# Patient Record
Sex: Female | Born: 1995 | Race: Black or African American | Hispanic: No | Marital: Married | Smoking: Never smoker
Health system: Southern US, Community
[De-identification: ages and names within clinical notes are randomized; demographics above are authoritative.]

## PROBLEM LIST (undated history)

## (undated) DIAGNOSIS — B009 Herpesviral infection, unspecified: Secondary | ICD-10-CM

## (undated) HISTORY — PX: OPEN REDUCTION INTERNAL FIXATION (ORIF) TIBIA/FIBULA FRACTURE: SHX5992

## (undated) HISTORY — DX: Herpesviral infection, unspecified: B00.9

---
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
## 2016-06-22 ENCOUNTER — Encounter (HOSPITAL_COMMUNITY): Payer: Self-pay | Admitting: *Deleted

## 2016-06-22 ENCOUNTER — Ambulatory Visit (HOSPITAL_COMMUNITY)
Admission: EM | Admit: 2016-06-22 | Discharge: 2016-06-22 | Disposition: A | Discharge status: Home / Self Care | Payer: Medicaid Other | Attending: Family Medicine | Admitting: Family Medicine

## 2016-06-22 DIAGNOSIS — Z79899 Other long term (current) drug therapy: Secondary | ICD-10-CM | POA: Insufficient documentation

## 2016-06-22 DIAGNOSIS — N3001 Acute cystitis with hematuria: Secondary | ICD-10-CM | POA: Insufficient documentation

## 2016-06-22 DIAGNOSIS — R300 Dysuria: Secondary | ICD-10-CM | POA: Diagnosis present

## 2016-06-22 LAB — POCT URINALYSIS DIP (DEVICE)
Bilirubin Urine: NEGATIVE
GLUCOSE, UA: NEGATIVE mg/dL
Ketones, ur: NEGATIVE mg/dL
Nitrite: NEGATIVE
PROTEIN: NEGATIVE mg/dL
Specific Gravity, Urine: 1.02 (ref 1.005–1.030)
UROBILINOGEN UA: 0.2 mg/dL (ref 0.0–1.0)
pH: 7 (ref 5.0–8.0)

## 2016-06-22 LAB — POCT PREGNANCY, URINE: Preg Test, Ur: NEGATIVE

## 2016-06-22 MED ORDER — CEPHALEXIN 500 MG PO CAPS: 500.0000 mg | ORAL_CAPSULE | Freq: Four times a day (QID) | ORAL | 0 refills | Status: DC

## 2016-06-22 MED ORDER — PHENAZOPYRIDINE HCL 200 MG PO TABS: 200.0000 mg | ORAL_TABLET | Freq: Three times a day (TID) | ORAL | 0 refills | Status: DC | PRN

## 2016-06-22 NOTE — Discharge Instructions (Addendum)
You are being treated today for a urinary tract infection. I have prescribed Keflex, take 1 tablet 4 times a day for 5 days. I have also prescribed Pyridium. Take 1 tablet a day 3 times a day for 2 days. Your urine will be sent for culture and you will be notified should any change in therapy be needed. Drink plenty of fluids and rest. Should your symptoms fail to resolve, follow up with your primary care provider or return to clinic.  °

## 2016-06-22 NOTE — ED Provider Notes (Signed)
CSN: [id number]     Arrival date & time 06/22/16  [id number] History   None    Chief Complaint  Patient presents with  . Dysuria   (Consider location/radiation/quality/duration/timing/severity/associated sxs/prior Treatment) 21 year old female presents to clinic with three-day chief complaint of dysuria, urgency, frequency, and chills. She denies any abdominal pain, denies fever, she denies any nausea, vomiting, diarrhea, states she has seen some traces of blood in her urine, and has been darker than normal. She is not currently on her period, she has a Implant for birth control, stating her periods are irregular.   The history is provided by the patient.  Dysuria    History reviewed. No pertinent past medical history. History reviewed. No pertinent surgical history. History reviewed. No pertinent family history. Social History  Substance Use Topics  . Smoking status: Never Smoker  . Smokeless tobacco: Never Used  . Alcohol use No   OB History    No data available     Review of Systems  Reason unable to perform ROS: As covered in history of present illness.  Genitourinary: Positive for dysuria.    Allergies  Codeine  Home Medications   Prior to Admission medications   Medication Sig Start Date End Date Taking? Authorizing Provider  ValACYclovir HCl (VALTREX PO) Take by mouth.   Yes Historical Provider, MD  amoxicillin (AMOXIL) 250 MG/5ML suspension 10 ml po TID x 10 days 04/21/13   Tammy Triplett, PA-C  cephALEXin (KEFLEX) 500 MG capsule Take 1 capsule (500 mg total) by mouth 4 (four) times daily. 06/22/16   Dorena BodoLawrence Goldie Tregoning, NP  phenazopyridine (PYRIDIUM) 200 MG tablet Take 1 tablet (200 mg total) by mouth 3 (three) times daily as needed for pain. 06/22/16   Dorena BodoLawrence Kyilee Gregg, NP   Meds Ordered and Administered this Visit  Medications - No data to display  BP 123/75 (BP Location: Right Arm)   Pulse 85   Temp 99.1 F (37.3 C) (Oral)   Resp 16   SpO2 100%  No data  found.   Physical Exam  Constitutional: She is oriented to person, place, and time. She appears well-developed and well-nourished. No distress.  Cardiovascular: Normal rate and regular rhythm.   Pulmonary/Chest: Effort normal and breath sounds normal.  Abdominal: Soft. Bowel sounds are normal. She exhibits no distension and no mass. There is no tenderness. There is no guarding and no CVA tenderness.  Neurological: She is alert and oriented to person, place, and time.  Skin: Skin is warm and dry. Capillary refill takes less than 2 seconds. She is not diaphoretic.  Psychiatric: She has a normal mood and affect.  Nursing note and vitals reviewed.   Urgent Care Course     Procedures (including critical care time)  Labs Review Labs Reviewed  POCT URINALYSIS DIP (DEVICE) - Abnormal; Notable for the following:       Result Value   Hgb urine dipstick TRACE (*)    Leukocytes, UA SMALL (*)    All other components within normal limits  URINE CULTURE  POCT PREGNANCY, URINE    Imaging Review No results found.   Visual Acuity Review  Right Eye Distance:   Left Eye Distance:   Bilateral Distance:    Right Eye Near:   Left Eye Near:    Bilateral Near:         MDM   1. Acute cystitis with hematuria    You are being treated today for a urinary tract infection. I have prescribed Keflex, take  1 tablet 4 times a day for 5 days. I have also prescribed Pyridium. Take 1 tablet a day 3 times a day for 2 days. Your urine will be sent for culture and you will be notified should any change in therapy be needed. Drink plenty of fluids and rest. Should your symptoms fail to resolve, follow up with your primary care provider or return to clinic.      Dorena Bodo, NP 06/22/16 2013

## 2016-06-22 NOTE — ED Triage Notes (Signed)
Pt  Reports  A   3  day   Episode   Of  Burning  On  Urination     And   some  Back  Pain  Pt    Dark  Blood   As   Well   After  Urination  Pt  Has  A  History  Of  Herpes  But taking  Anti  Virals

## 2016-06-24 LAB — URINE CULTURE

## 2016-08-03 ENCOUNTER — Ambulatory Visit (HOSPITAL_COMMUNITY)
Admission: EM | Admit: 2016-08-03 | Discharge: 2016-08-03 | Disposition: A | Discharge status: Home / Self Care | Payer: Medicaid Other | Attending: Family Medicine | Admitting: Family Medicine

## 2016-08-03 ENCOUNTER — Emergency Department (HOSPITAL_COMMUNITY)
Admission: EM | Admit: 2016-08-03 | Discharge: 2016-08-03 | Discharge status: Against Medical Advice | Payer: Medicaid Other

## 2016-08-03 ENCOUNTER — Encounter (HOSPITAL_COMMUNITY): Payer: Self-pay | Admitting: Emergency Medicine

## 2016-08-03 DIAGNOSIS — R519 Headache, unspecified: Secondary | ICD-10-CM

## 2016-08-03 DIAGNOSIS — R51 Headache: Secondary | ICD-10-CM

## 2016-08-03 MED ORDER — BUTALBITAL-APAP-CAFFEINE 50-325-40 MG PO TABS: 1.0000 | ORAL_TABLET | Freq: Four times a day (QID) | ORAL | 0 refills | Status: AC | PRN

## 2016-08-03 NOTE — ED Triage Notes (Signed)
Pt has been suffering from a headache since yesterday.  She also reports blurred vision and dizziness that has since subsided.

## 2016-08-03 NOTE — ED Provider Notes (Signed)
[facility]    CSN: [id number] Arrival date & time: 08/03/16  1455     History   Chief Complaint Chief Complaint  Patient presents with  . Headache    HPI Meredith Hubbard is a 21 y.o. female.   Pt has been suffering from a headache since yesterday.  She also reports blurred vision and dizziness that has since subsided.  Works in a call center. She's had no nausea or vomiting. The headache feels like there is a band around her temples giving her steady pressure and sensation. She has no photophobia. She describes the pain as 8 out of 10.       History reviewed. No pertinent past medical history.  There are no active problems to display for this patient.   History reviewed. No pertinent surgical history.  OB History    No data available       Home Medications    Prior to Admission medications   Medication Sig Start Date End Date Taking? Authorizing Provider  ValACYclovir HCl (VALTREX PO) Take by mouth.   Yes Historical Provider, MD  butalbital-acetaminophen-caffeine (FIORICET, ESGIC) [phone] MG tablet Take 1-2 tablets by mouth every 6 (six) hours as needed for headache. 08/03/16 08/03/17  Elvina Sidle, MD  cephALEXin (KEFLEX) 500 MG capsule Take 1 capsule (500 mg total) by mouth 4 (four) times daily. 06/22/16   Dorena Bodo, NP  phenazopyridine (PYRIDIUM) 200 MG tablet Take 1 tablet (200 mg total) by mouth 3 (three) times daily as needed for pain. 06/22/16   Dorena Bodo, NP    Family History History reviewed. No pertinent family history.  Social History Social History  Substance Use Topics  . Smoking status: Never Smoker  . Smokeless tobacco: Never Used  . Alcohol use No     Allergies   Codeine   Review of Systems Review of Systems  HENT: Negative.   Gastrointestinal: Negative.   Neurological: Positive for dizziness and headaches.  All other systems reviewed and are negative.    Physical Exam Triage Vital Signs ED Triage  Vitals [08/03/16 1510]  Enc Vitals Group     BP      Pulse      Resp      Temp      Temp src      SpO2      Weight      Height      Head Circumference      Peak Flow      Pain Score 8     Pain Loc      Pain Edu?      Excl. in GC?    No data found.   Updated Vital Signs BP 119/79 (BP Location: Right Arm)   Pulse 84   Temp 98.4 F (36.9 C) (Oral)   LMP 07/26/2016 (Approximate)   SpO2 98%    Physical Exam  Constitutional: She appears well-developed and well-nourished.  HENT:  Head: Normocephalic.  Right Ear: External ear normal.  Left Ear: External ear normal.  Mouth/Throat: Oropharynx is clear and moist.  Eyes: Conjunctivae and EOM are normal. Pupils are equal, round, and reactive to light.  Neck: Normal range of motion. Neck supple.  Pulmonary/Chest: Effort normal.  Musculoskeletal: Normal range of motion.  Neurological: She is alert. No cranial nerve deficit. She exhibits normal muscle tone. Coordination normal.  Skin: Skin is warm and dry.  Nursing note and vitals reviewed.    UC Treatments / Results  Labs (all labs  ordered are listed, but only abnormal results are displayed) Labs Reviewed - No data to display  EKG  EKG Interpretation None       Radiology No results found.  Procedures Procedures (including critical care time)  Medications Ordered in UC Medications - No data to display   Initial Impression / Assessment and Plan / UC Course  I have reviewed the triage vital signs and the nursing notes.  Pertinent labs & imaging results that were available during my care of the patient were reviewed by me and considered in my medical decision making (see chart for details).     Final Clinical Impressions(s) / UC Diagnoses   Final diagnoses:  Bad headache    New Prescriptions New Prescriptions   BUTALBITAL-ACETAMINOPHEN-CAFFEINE (FIORICET, ESGIC) 50-325-40 MG TABLET    Take 1-2 tablets by mouth every 6 (six) hours as needed for headache.      Elvina Sidle, MD 08/03/16 [phone]

## 2016-08-03 NOTE — Discharge Instructions (Signed)
Return either here or the emergency room if headache worsens or does not resolve over the next 6-10 hours.

## 2018-05-04 ENCOUNTER — Other Ambulatory Visit: Payer: Self-pay

## 2018-05-04 ENCOUNTER — Encounter: Payer: Self-pay | Admitting: Emergency Medicine

## 2018-05-04 ENCOUNTER — Emergency Department
Admission: EM | Admit: 2018-05-04 | Discharge: 2018-05-04 | Disposition: A | Discharge status: Home / Self Care | Payer: Self-pay | Attending: Emergency Medicine | Admitting: Emergency Medicine

## 2018-05-04 DIAGNOSIS — J101 Influenza due to other identified influenza virus with other respiratory manifestations: Secondary | ICD-10-CM | POA: Insufficient documentation

## 2018-05-04 DIAGNOSIS — Z79899 Other long term (current) drug therapy: Secondary | ICD-10-CM | POA: Insufficient documentation

## 2018-05-04 LAB — INFLUENZA PANEL BY PCR (TYPE A & B)
Influenza A By PCR: NEGATIVE
Influenza B By PCR: POSITIVE — AB

## 2018-05-04 MED ORDER — ACETAMINOPHEN 325 MG PO TABS
650.0000 mg | ORAL_TABLET | Freq: Once | ORAL | Status: AC
  Administered 2018-05-04: 650 mg via ORAL

## 2018-05-04 MED ORDER — PSEUDOEPH-BROMPHEN-DM 30-2-10 MG/5ML PO SYRP: 5.0000 mL | ORAL_SOLUTION | Freq: Four times a day (QID) | ORAL | 0 refills | Status: DC | PRN

## 2018-05-04 MED ORDER — ACETAMINOPHEN 325 MG PO TABS
ORAL_TABLET | ORAL | Status: AC
  Filled 2018-05-04: qty 2

## 2018-05-04 MED ORDER — OSELTAMIVIR PHOSPHATE 75 MG PO CAPS: 75.0000 mg | ORAL_CAPSULE | Freq: Two times a day (BID) | ORAL | 0 refills | Status: AC

## 2018-05-04 NOTE — Discharge Instructions (Addendum)
Advised over-the-counter extra strength Tylenol/ibuprofen for body aches and fever.

## 2018-05-04 NOTE — ED Notes (Signed)
Pt has young child with her, pt's SO is also being seen, informed pt about flu restrictions, pt states she is trying to get her son's dad to come get him. Pt has mask applied on arrival.

## 2018-05-04 NOTE — ED Triage Notes (Addendum)
PT arrived via POV with reports of chills and fever for 3 days, pt states temp max at home was 102 this morning.  Pt states she took ibuprofen and tylenol as well as alka seltzer with no relief. Pt also reports body aches, has had sick contacts with children.

## 2018-05-04 NOTE — ED Provider Notes (Signed)
[facility] Emergency Department Provider Note   ____________________________________________   First MD Initiated Contact with Patient 05/04/18 1231     (approximate)  I have reviewed the triage vital signs and the nursing notes.   HISTORY  Chief Complaint Fever and Chills    HPI Meredith Hubbard is a 23 y.o. female patient presents with 3 days of chills and fever.  Patient states fevers is controlled with Tylenol ibuprofen.  Patient state nasal congestion, body aches and exposure to flu.  Patient not taken flu shot for this season.  Patient denies nausea, vomiting, diarrhea.  History reviewed. No pertinent past medical history.  There are no active problems to display for this patient.   History reviewed. No pertinent surgical history.  Prior to Admission medications   Medication Sig Start Date End Date Taking? Authorizing Provider  brompheniramine-pseudoephedrine-DM 30-2-10 MG/5ML syrup Take 5 mLs by mouth 4 (four) times daily as needed. 05/04/18   Joni Reining, PA-C  oseltamivir (TAMIFLU) 75 MG capsule Take 1 capsule (75 mg total) by mouth 2 (two) times daily for 5 days. 05/04/18 05/09/18  Joni Reining, PA-C  ValACYclovir HCl (VALTREX PO) Take by mouth.    [provider]    Allergies Codeine  History reviewed. No pertinent family history.  Social History Social History   Tobacco Use  . Smoking status: Never Smoker  . Smokeless tobacco: Never Used  Substance Use Topics  . Alcohol use: No  . Drug use: No    Review of Systems Constitutional: Fever/chills and body aches.  Eyes: No visual changes. ENT: No sore throat.  Nasal congestion and runny nose. Cardiovascular: Denies chest pain. Respiratory: Denies shortness of breath.  Nonproductive cough. Gastrointestinal: No abdominal pain.  No nausea, no vomiting.  No diarrhea.  No constipation. Genitourinary: Negative for dysuria. Musculoskeletal: Negative for back pain. Skin:  Negative for rash. Neurological: Negative for headaches, focal weakness or numbness. Allergic/Immunilogical: Codeine. ____________________________________________   PHYSICAL EXAM:  VITAL SIGNS: ED Triage Vitals  Enc Vitals Group     BP 05/04/18 1226 (!) 118/92     Pulse Rate 05/04/18 1226 99     Resp 05/04/18 1226 18     Temp 05/04/18 1226 99 F (37.2 C)     Temp Source 05/04/18 1226 Oral     SpO2 05/04/18 1226 97 %     Weight 05/04/18 1223 170 lb (77.1 kg)     Height 05/04/18 1223 5\' 1"  (1.549 m)     Head Circumference --      Peak Flow --      Pain Score --      Pain Loc --      Pain Edu? --      Excl. in GC? --    Constitutional: Alert and oriented. Well appearing and in no acute distress. Nose: Edematous nasal turbinates clear rhinorrhea.   Mouth/Throat: Mucous membranes are moist.  Oropharynx non-erythematous.  Postnasal drainage. Neck: No stridor. Hematological/Lymphatic/Immunilogical: No cervical lymphadenopathy. Cardiovascular: Normal rate, regular rhythm. Grossly normal heart sounds.  Good peripheral circulation. Respiratory: Normal respiratory effort.  No retractions. Lungs CTAB. Gastrointestinal: Soft and nontender. No distention. No abdominal bruits. No CVA tenderness. Neurologic:  Normal speech and language. No gross focal neurologic deficits are appreciated. No gait instability. Skin:  Skin is warm, dry and intact. No rash noted. Psychiatric: Mood and affect are normal. Speech and behavior are normal.  ____________________________________________   LABS (all labs ordered are listed, but only abnormal  results are displayed)  Labs Reviewed  INFLUENZA PANEL BY PCR (TYPE A & B) - Abnormal; Notable for the following components:      Result Value   Influenza B By PCR POSITIVE (*)    All other components within normal limits   ____________________________________________  EKG   ____________________________________________  RADIOLOGY  ED MD  interpretation:    Official radiology report(s): No results found.  ____________________________________________   PROCEDURES  Procedure(s) performed: None  Procedures  Critical Care performed: No  ____________________________________________   INITIAL IMPRESSION / ASSESSMENT AND PLAN / ED COURSE  As part of my medical decision making, I reviewed the following data within the electronic MEDICAL RECORD NUMBER   Patient presents with acute onset of fever/chills and body aches.  Patient was positive for influenza B.  Patient given discharge care instruction advised take medication as directed.  Patient advised follow-up today [facility] as needed.       ____________________________________________   FINAL CLINICAL IMPRESSION(S) / ED DIAGNOSES  Final diagnoses:  Influenza B     ED Discharge Orders         Ordered    oseltamivir (TAMIFLU) 75 MG capsule  2 times daily     05/04/18 1354    brompheniramine-pseudoephedrine-DM 30-2-10 MG/5ML syrup  4 times daily PRN     05/04/18 1354           Note:  This document was prepared using Dragon voice recognition software and may include unintentional dictation errors.    Joni Reining, PA-C 05/04/18 1356    Phineas Semen, MD 05/04/18 2033

## 2018-05-04 NOTE — ED Notes (Signed)
See triage note  Presents with body aches and fever  States fever at home was 102  Has been taking OTC meds for fever  Low grade fever noted on arrival

## 2018-11-24 ENCOUNTER — Other Ambulatory Visit: Payer: Self-pay

## 2018-11-24 ENCOUNTER — Encounter: Payer: Self-pay | Admitting: Emergency Medicine

## 2018-11-24 DIAGNOSIS — Z5321 Procedure and treatment not carried out due to patient leaving prior to being seen by health care provider: Secondary | ICD-10-CM | POA: Insufficient documentation

## 2018-11-24 DIAGNOSIS — R1084 Generalized abdominal pain: Secondary | ICD-10-CM | POA: Insufficient documentation

## 2018-11-24 NOTE — ED Triage Notes (Addendum)
Pt says she's been having vaginal bleeding for 32 days; today the flow increased to include large clots; changing tampon or pad every 30 minutes; pt says she's feeling a little weak; reports abd cramping, today the worst; pt says she was prescribed birth control pills by her provider to stop the bleeding; quit taking after one week because the bleeding didn't stop

## 2018-11-25 ENCOUNTER — Emergency Department
Admission: EM | Admit: 2018-11-25 | Discharge: 2018-11-25 | Disposition: A | Discharge status: Home / Self Care | Payer: Self-pay | Attending: Emergency Medicine | Admitting: Emergency Medicine

## 2018-11-25 ENCOUNTER — Emergency Department
Admission: EM | Admit: 2018-11-25 | Discharge: 2018-11-25 | Discharge status: Against Medical Advice | Payer: Self-pay | Attending: Emergency Medicine | Admitting: Emergency Medicine

## 2018-11-25 ENCOUNTER — Other Ambulatory Visit: Payer: Self-pay

## 2018-11-25 ENCOUNTER — Encounter: Payer: Self-pay | Admitting: Emergency Medicine

## 2018-11-25 DIAGNOSIS — N938 Other specified abnormal uterine and vaginal bleeding: Secondary | ICD-10-CM | POA: Insufficient documentation

## 2018-11-25 DIAGNOSIS — N939 Abnormal uterine and vaginal bleeding, unspecified: Secondary | ICD-10-CM

## 2018-11-25 LAB — URINALYSIS, COMPLETE (UACMP) WITH MICROSCOPIC
Bacteria, UA: NONE SEEN
RBC / HPF: >50 RBC/hpf — ABNORMAL HIGH (ref 0–5)
Specific Gravity, Urine: 1.029 (ref 1.005–1.030)
WBC, UA: >50 WBC/hpf — ABNORMAL HIGH (ref 0–5)

## 2018-11-25 LAB — LIPASE, BLOOD: Lipase: 28 U/L (ref 11–51)

## 2018-11-25 LAB — BASIC METABOLIC PANEL
Anion gap: 7 (ref 5–15)
BUN: 11 mg/dL (ref 6–20)
CO2: 24 mmol/L (ref 22–32)
Calcium: 8.8 mg/dL — ABNORMAL LOW (ref 8.9–10.3)
Chloride: 108 mmol/L (ref 98–111)
Creatinine, Ser: 0.64 mg/dL (ref 0.44–1.00)
GFR calc Af Amer: >60 mL/min (ref >60)
GFR calc non Af Amer: >60 mL/min (ref >60)
Glucose, Bld: 84 mg/dL (ref 70–99)
Potassium: 4 mmol/L (ref 3.5–5.1)
Sodium: 139 mmol/L (ref 135–145)

## 2018-11-25 LAB — CBC
HCT: 35.3 % — ABNORMAL LOW (ref 36.0–46.0)
HCT: 36.3 % (ref 36.0–46.0)
Hemoglobin: 11.6 g/dL — ABNORMAL LOW (ref 12.0–15.0)
Hemoglobin: 12 g/dL (ref 12.0–15.0)
MCH: 30.1 pg (ref 26.0–34.0)
MCH: 30.7 pg (ref 26.0–34.0)
MCHC: 32.9 g/dL (ref 30.0–36.0)
MCHC: 33.1 g/dL (ref 30.0–36.0)
MCV: 91 fL (ref 80.0–100.0)
MCV: 93.4 fL (ref 80.0–100.0)
Platelets: 284 10*3/uL (ref 150–400)
Platelets: 295 10*3/uL (ref 150–400)
RBC: 3.78 MIL/uL — ABNORMAL LOW (ref 3.87–5.11)
RBC: 3.99 MIL/uL (ref 3.87–5.11)
RDW: 12.9 % (ref 11.5–15.5)
RDW: 13.1 % (ref 11.5–15.5)
WBC: 11.5 10*3/uL — ABNORMAL HIGH (ref 4.0–10.5)
WBC: 8.7 10*3/uL (ref 4.0–10.5)
nRBC: 0 % (ref 0.0–0.2)
nRBC: 0 % (ref 0.0–0.2)

## 2018-11-25 LAB — COMPREHENSIVE METABOLIC PANEL
ALT: 17 U/L (ref 0–44)
AST: 20 U/L (ref 15–41)
Albumin: 4.1 g/dL (ref 3.5–5.0)
Alkaline Phosphatase: 73 U/L (ref 38–126)
Anion gap: 10 (ref 5–15)
BUN: 13 mg/dL (ref 6–20)
CO2: 24 mmol/L (ref 22–32)
Calcium: 9.3 mg/dL (ref 8.9–10.3)
Chloride: 105 mmol/L (ref 98–111)
Creatinine, Ser: 0.6 mg/dL (ref 0.44–1.00)
GFR calc Af Amer: >60 mL/min (ref >60)
GFR calc non Af Amer: >60 mL/min (ref >60)
Glucose, Bld: 97 mg/dL (ref 70–99)
Potassium: 3.8 mmol/L (ref 3.5–5.1)
Sodium: 139 mmol/L (ref 135–145)
Total Bilirubin: 0.4 mg/dL (ref 0.3–1.2)
Total Protein: 7.4 g/dL (ref 6.5–8.1)

## 2018-11-25 LAB — POCT PREGNANCY, URINE
Preg Test, Ur: NEGATIVE
Preg Test, Ur: NEGATIVE

## 2018-11-25 NOTE — ED Provider Notes (Addendum)
[facility] Emergency Department Provider Note   ____________________________________________   First MD Initiated Contact with Patient 11/25/18 1657     (approximate)  I have reviewed the triage vital signs and the nursing notes.   HISTORY  Chief Complaint Vaginal Bleeding   HPI Meredith Hubbard is a 23 y.o. female who reports vaginal bleeding for 32 days.  Today the flow increased and had some large clots.  She says she is changing her tampon or pad every 30 minutes.  Here in the emergency room when I checked her her bleeding was just a very very slow ooze.  She is not have any further pain.  She said she was prescribed birth control pills by her OB/GYN in [place] but she stopped taking them after week because the bleeding did not stop.  I discussed her with Dr. Feliberto GottronSchermerhorn on-call for OB.  We will start her back on her birth control pills and have her double up for the first 5 days.  She will follow-up with her OB/GYN in [place] this week.  She will return here for increased bleeding cramping fever lightheadedness or any other problems.         History reviewed. No pertinent past medical history.  There are no active problems to display for this patient.   History reviewed. No pertinent surgical history. Patient has had vaginal bleeding for longer periods before this but never quite this long. Prior to Admission medications   Medication Sig Start Date End Date Taking? Authorizing Provider  ValACYclovir HCl (VALTREX PO) Take by mouth.    [provider]    Allergies Codeine  History reviewed. No pertinent family history.  Social History Social History   Tobacco Use  . Smoking status: Never Smoker  . Smokeless tobacco: Never Used  Substance Use Topics  . Alcohol use: Yes  . Drug use: No    Review of Systems Currently Constitutional: No fever/chills Eyes: No visual changes. ENT: No sore throat. Cardiovascular: Denies  chest pain. Respiratory: Denies shortness of breath. Gastrointestinal: No abdominal pain.  No nausea, no vomiting.  No diarrhea.  No constipation. Genitourinary: Negative for dysuria. Musculoskeletal: Negative for back pain. Skin: Negative for rash. Neurological: Negative for headaches, focal weakness o  ____________________________________________   PHYSICAL EXAM:  VITAL SIGNS: ED Triage Vitals  Enc Vitals Group     BP 11/25/18 1247 133/81     Pulse Rate 11/25/18 1247 99     Resp 11/25/18 1247 18     Temp 11/25/18 1247 (!) 97.5 F (36.4 C)     Temp Source 11/25/18 1247 Oral     SpO2 11/25/18 1247 100 %     Weight 11/25/18 1243 190 lb 14.7 oz (86.6 kg)     Height 11/25/18 1243 5\' 1"  (1.549 m)     Head Circumference --      Peak Flow --      Pain Score 11/25/18 1248 9     Pain Loc --      Pain Edu? --      Excl. in GC? --     Constitutional: Alert and oriented. Well appearing and in no acute distress. Eyes: Conjunctivae are normal.  Head: Atraumatic. Nose: No congestion/rhinnorhea. Mouth/Throat: Mucous membranes are moist.  Oropharynx non-erythematous. Neck: No stridor.   Cardiovascular: Normal rate, regular rhythm. Grossly normal heart sounds.  Good peripheral circulation. Respiratory: Normal respiratory effort.  No retractions. Lungs CTAB. Gastrointestinal: Soft and nontender. No distention. No abdominal bruits. No  CVA tenderness. Genitourinary: Normal perineum.  Small amount of dark blood in the vagina.  This is removed there is a very slow ooze of dark blood after this.  No masses are palpable.  No cervical motion tenderness. Musculoskeletal: No lower extremity tenderness nor edema.  No joint effusions. Neurologic:  Normal speech and language. No gross focal neurologic deficits are appreciated.  Skin:  Skin is warm, dry and intact. No rash noted.   ____________________________________________   LABS (all labs ordered are listed, but only abnormal results are  displayed)  Labs Reviewed  BASIC METABOLIC PANEL - Abnormal; Notable for the following components:      Result Value   Calcium 8.8 (*)    All other components within normal limits  CBC  POC URINE PREG, ED  POCT PREGNANCY, URINE   ____________________________________________  EKG   ____________________________________________  RADIOLOGY  ED MD interpretation:   Official radiology report(s): No results found.  ____________________________________________   PROCEDURES  Procedure(s) performed (including Critical Care):  Procedures   ____________________________________________   INITIAL IMPRESSION / ASSESSMENT AND PLAN / ED COURSE  Dyane Broberg was evaluated in Emergency Department on 11/25/2018 for the symptoms described in the history of present illness. She was evaluated in the context of the global COVID-19 pandemic, which necessitated consideration that the patient might be at risk for infection with the SARS-CoV-2 virus that causes COVID-19. Institutional protocols and algorithms that pertain to the evaluation of patients at risk for COVID-19 are in a state of rapid change based on information released by regulatory bodies including the CDC and federal and state organizations. These policies and algorithms were followed during the patient's care in the ED. Please see HPI for further decision-making Patient believes she has a refill at the pharmacy.  She will check.  If there is any problem she will call me and I can talk to the pharmacy and give her the refill that way.  She does not remember the brand of her birth control pills             ____________________________________________   FINAL CLINICAL IMPRESSION(S) / ED DIAGNOSES  Final diagnoses:  Vaginal bleeding     ED Discharge Orders    None       Note:  This document was prepared using Dragon voice recognition software and may include unintentional dictation errors.    Nena Polio, MD  11/25/18 2105    Nena Polio, MD 11/25/18 2105

## 2018-11-25 NOTE — ED Notes (Signed)
Patient not observed in room.

## 2018-11-25 NOTE — ED Notes (Signed)
Urine sent to lab with save label.  Pt reports soaking through pads while waiting to be triaged, pt provided disposable underwear and extra pads.

## 2018-11-25 NOTE — ED Triage Notes (Signed)
Pt arrived via POV with reports of vaginal bleeding over a month, pt reports an increased flow since yesterday, frequently having to change a pad or tampon about every 30 minutes.  Pt states she was prescribed birth control pills to help with the bleeding but stopped taking after 1 week because the bleeding didn't stop.  Pt also reports abdominal cramping and feeling weak due to blood loss.

## 2018-11-25 NOTE — Discharge Instructions (Addendum)
I spoke to our OB/GYN doctor.  He suggested you take 1 of your birth control pills twice a day and follow-up with your own OB/GYN tomorrow or this coming week.  Your lab work looks stable.  Right now you are not having heavy bleeding he should be okay for this.  Please go to [facility] and see if they can refill your birth control pills.  If they cannot have them call me while you are there.  The ER phone number is [phone].

## 2020-04-25 NOTE — L&D Delivery Note (Signed)
Delivery Note The patient was given epidural anesthesia  and shortly therafter was foound to be completely dilated. AROM was performed and meconium fluid seen. An FSE was placed to better trace FHTS, and the patient was encouraged to begin pushing.  Dr. Jerene Pitch joined this provider to assist secondary to deep decels, and at   0213, a viable female was delivered  vaginally over an intact perineum with a tiny periurethral tear.. The vertex presented OA, and "turtle sign was noted. Using McRoberts maneuver and downward guidance by Dr. Jerene Pitch, the anterior should was delivered. While Ms Eunice Blase CNM provided perineal support, kr. Schuman used upward guidance to deliver the posterior shoulder, and the baby was placed on the maternal abdomen  for stimulation and skin to skin care.(Presentation:   OA to ROT   ).  APGAR: 8,9 ; weight  pending.   Placenta status:  ,delivered intact at 0222  .  Cord: 3 vessel  with the following complications:  moderate meconium fluid, and insufficient treatment for maternal GBS status..    Anesthesia:  epidural Episiotomy:  none Lacerations:  small periurethral Suture Repair:  4-0 Est. Blood Loss (mL):  100  Mom to postpartum.  Baby to Couplet care / Skin to Skin.  Mirna Mires 03/28/2021, 2:43 AM

## 2020-08-10 ENCOUNTER — Encounter: Payer: Self-pay | Admitting: Intensive Care

## 2020-08-10 ENCOUNTER — Emergency Department: Payer: Medicaid Other

## 2020-08-10 ENCOUNTER — Emergency Department
Admission: EM | Admit: 2020-08-10 | Discharge: 2020-08-10 | Disposition: A | Discharge status: Home / Self Care | Payer: Medicaid Other | Attending: Emergency Medicine | Admitting: Emergency Medicine

## 2020-08-10 ENCOUNTER — Other Ambulatory Visit: Payer: Self-pay

## 2020-08-10 DIAGNOSIS — O200 Threatened abortion: Secondary | ICD-10-CM | POA: Diagnosis not present

## 2020-08-10 DIAGNOSIS — Z3A01 Less than 8 weeks gestation of pregnancy: Secondary | ICD-10-CM | POA: Diagnosis not present

## 2020-08-10 HISTORY — DX: Herpesviral infection, unspecified: B00.9

## 2020-08-10 LAB — BASIC METABOLIC PANEL
Anion gap: 9 (ref 5–15)
BUN: 10 mg/dL (ref 6–20)
CO2: 22 mmol/L (ref 22–32)
Calcium: 9 mg/dL (ref 8.9–10.3)
Chloride: 102 mmol/L (ref 98–111)
Creatinine, Ser: 0.65 mg/dL (ref 0.44–1.00)
GFR, Estimated: >60 mL/min (ref >60)
Glucose, Bld: 70 mg/dL (ref 70–99)
Potassium: 3.8 mmol/L (ref 3.5–5.1)
Sodium: 133 mmol/L — ABNORMAL LOW (ref 135–145)

## 2020-08-10 LAB — CBC
HCT: 34.7 % — ABNORMAL LOW (ref 36.0–46.0)
Hemoglobin: 11.8 g/dL — ABNORMAL LOW (ref 12.0–15.0)
MCH: 30.3 pg (ref 26.0–34.0)
MCHC: 34 g/dL (ref 30.0–36.0)
MCV: 89 fL (ref 80.0–100.0)
Platelets: 251 10*3/uL (ref 150–400)
RBC: 3.9 MIL/uL (ref 3.87–5.11)
RDW: 12.8 % (ref 11.5–15.5)
WBC: 10.9 10*3/uL — ABNORMAL HIGH (ref 4.0–10.5)
nRBC: 0 % (ref 0.0–0.2)

## 2020-08-10 LAB — HCG, QUANTITATIVE, PREGNANCY: hCG, Beta Chain, Quant, S: 109144 m[IU]/mL — ABNORMAL HIGH (ref <5)

## 2020-08-10 NOTE — ED Notes (Addendum)
Pen pad in room not working. Patient verbalized understanding of discharge and had no questions for this RN.

## 2020-08-10 NOTE — ED Provider Notes (Signed)
[facility] Emergency Department Provider Note  Time seen: 4:25 PM  I have reviewed the triage vital signs and the nursing notes.   HISTORY  Chief Complaint Vaginal Bleeding   HPI Meredith Hubbard is a 25 y.o. female with no significant past medical history who presents to the emergency department with vaginal spotting and a positive home pregnancy test.  According to the patient she took a home pregnancy test on Saturday as she has been having some lower abdominal/back cramping.  Patient states her pregnancy test was positive however today she noticed some light spotting so she came to the emergency department for evaluation.  Denies any significant lower abdominal pain.  States very minimal spotting without clots or tissue.  Denies vaginal discharge.  Patient has an OB appointment on Thursday.  Patient states her last known period was January 22 however she states a history of very irregular periods.   Past Medical History:  Diagnosis Date  . Herpes     There are no problems to display for this patient.   History reviewed. No pertinent surgical history.  Prior to Admission medications   Medication Sig Start Date End Date Taking? Authorizing Provider  ValACYclovir HCl (VALTREX PO) Take by mouth.    [provider]    Allergies  Allergen Reactions  . Codeine     History reviewed. No pertinent family history.  Social History Social History   Tobacco Use  . Smoking status: Never Smoker  . Smokeless tobacco: Never Used  Substance Use Topics  . Alcohol use: Never  . Drug use: Yes    Types: Marijuana    Review of Systems Constitutional: Negative for fever Cardiovascular: Negative for chest pain. Respiratory: Negative for shortness of breath. Gastrointestinal: Intermittent mild lower abdominal cramping.  None currently. Genitourinary: Negative for urinary compaints.  Vaginal spotting. Musculoskeletal: Mild lower back discomfort at  times. Neurological: Negative for headache All other ROS negative  ____________________________________________   PHYSICAL EXAM:  VITAL SIGNS: ED Triage Vitals  Enc Vitals Group     BP 08/10/20 1614 132/84     Pulse Rate 08/10/20 1614 90     Resp 08/10/20 1614 18     Temp 08/10/20 1614 98.5 F (36.9 C)     Temp Source 08/10/20 1614 Oral     SpO2 08/10/20 1614 98 %     Weight 08/10/20 1617 188 lb (85.3 kg)     Height 08/10/20 1617 5' 1.5" (1.562 m)     Head Circumference --      Peak Flow --      Pain Score 08/10/20 1617 6     Pain Loc --      Pain Edu? --      Excl. in GC? --    Constitutional: Alert and oriented. Well appearing and in no distress. Eyes: Normal exam ENT      Head: Normocephalic and atraumatic.      Mouth/Throat: Mucous membranes are moist. Cardiovascular: Normal rate, regular rhythm.  Respiratory: Normal respiratory effort without tachypnea nor retractions. Breath sounds are clear Gastrointestinal: Soft and nontender. No distention. Musculoskeletal: Nontender with normal range of motion in all extremities. Neurologic:  Normal speech and language. No gross focal neurologic deficits  Skin:  Skin is warm, dry and intact.  Psychiatric: Mood and affect are normal.   ____________________________________________   RADIOLOGY  Ultrasound consistent with 6-week 5-day pregnancy.  ____________________________________________   INITIAL IMPRESSION / ASSESSMENT AND PLAN / ED COURSE  Pertinent labs &  imaging results that were available during my care of the patient were reviewed by me and considered in my medical decision making (see chart for details).   Patient presents emergency department for lower abdominal/lower back discomfort and a positive pregnancy test at home now with vaginal spotting today.  Overall the patient appears well, no distress.  Differential would include threatened miscarriage, subchorionic hemorrhage, completed miscarriage, ectopic.  We  will check labs including beta hCG and ABO/Rh.  We will obtain an ultrasound and continue to closely monitor.  Lab work is reassuring.  AB+ blood type.  Ultrasound shows 6-week 5-day pregnancy with a heart rate.  Patient will be discharged with routine OB follow-up.  Meredith Hubbard was evaluated in Emergency Department on 08/10/2020 for the symptoms described in the history of present illness. She was evaluated in the context of the global COVID-19 pandemic, which necessitated consideration that the patient might be at risk for infection with the SARS-CoV-2 virus that causes COVID-19. Institutional protocols and algorithms that pertain to the evaluation of patients at risk for COVID-19 are in a state of rapid change based on information released by regulatory bodies including the CDC and federal and state organizations. These policies and algorithms were followed during the patient's care in the ED.  ____________________________________________   FINAL CLINICAL IMPRESSION(S) / ED DIAGNOSES  Threatened miscarriage   Minna Antis, MD 08/10/20 [name]

## 2020-08-10 NOTE — ED Triage Notes (Signed)
Patient c/o vaginal spotting that started this AM. Reports color is light tinged pink and small amount. Positive pregnancy test on Saturday. Menstrual cycles are irregular and has not had period since January

## 2020-08-12 LAB — ABO/RH: ABO/RH(D): AB POS

## 2020-08-13 ENCOUNTER — Encounter: Payer: Self-pay | Admitting: Obstetrics

## 2020-08-13 ENCOUNTER — Other Ambulatory Visit (HOSPITAL_COMMUNITY)
Admission: RE | Admit: 2020-08-13 | Discharge: 2020-08-13 | Disposition: A | Discharge status: Home / Self Care | Payer: Medicaid Other | Source: Ambulatory Visit | Attending: Obstetrics | Admitting: Obstetrics

## 2020-08-13 ENCOUNTER — Ambulatory Visit (INDEPENDENT_AMBULATORY_CARE_PROVIDER_SITE_OTHER): Payer: Medicaid Other | Admitting: Obstetrics

## 2020-08-13 ENCOUNTER — Other Ambulatory Visit: Payer: Self-pay

## 2020-08-13 VITALS — BP 120/70 | Wt 187.0 lb

## 2020-08-13 DIAGNOSIS — Z3400 Encounter for supervision of normal first pregnancy, unspecified trimester: Secondary | ICD-10-CM

## 2020-08-13 DIAGNOSIS — Z124 Encounter for screening for malignant neoplasm of cervix: Secondary | ICD-10-CM | POA: Insufficient documentation

## 2020-08-13 DIAGNOSIS — Z3480 Encounter for supervision of other normal pregnancy, unspecified trimester: Secondary | ICD-10-CM

## 2020-08-13 DIAGNOSIS — O281 Abnormal biochemical finding on antenatal screening of mother: Secondary | ICD-10-CM

## 2020-08-13 DIAGNOSIS — Z3A01 Less than 8 weeks gestation of pregnancy: Secondary | ICD-10-CM

## 2020-08-13 NOTE — Progress Notes (Signed)
New Obstetric Patient H&P    Chief Complaint: "Desires prenatal care"   History of Present Illness: Patient is a 25 y.o. G2P1001 Not Hispanic or Latino female, LMP unknown presents with amenorrhea and positive home pregnancy test. Based on her  LMP, her EDD is Estimated Date of Delivery: 03/31/21 and her EGA is [redacted]w[redacted]d. Cycles are 5. days, irregular, and occur approximately every : not applicable days. Her last pap smear was about 1 years ago and was no abnormalities.    She had a urine pregnancy test which was positive about 2 week(s)  ago. Her last menstrual period was normal and lasted for  about 5 day(s). Since her LMP she claims she has experienced breast tenderness and nausea.. She denies vaginal bleeding. Her past medical history is noncontributory. Her prior pregnancies are notable for none  Since her LMP, she admits to the use of tobacco products  No, but she has been a chronic MJ user and quit several weeks ago. She claims she has gained   no pounds since the start of her pregnancy.  There are cats in the home in the home  no  She admits close contact with children on a regular basis  yes  She has had chicken pox in the past unknown She has had Tuberculosis exposures, symptoms, or previously tested positive for TB   no Current or past history of domestic violence. no  Genetic Screening/Teratology Counseling: (Includes patient, baby's father, or anyone in either family with:)   1. Patient's age >/= 67 at [facility]  no 2. Thalassemia ([place], [place], Mediterranean, or Asian background): MCV<80  no 3. Neural tube defect (meningomyelocele, spina bifida, anencephaly)  no 4. Congenital heart defect  no  5. Down syndrome  no 6. Tay-Sachs (Jewish, [place]  no 7. Canavan's Disease  no 8. Sickle cell disease or trait (African)  no  9. Hemophilia or other blood disorders  no  10. Muscular dystrophy  no  11. Cystic fibrosis  no  12. Huntington's Chorea  no  13. Mental  retardation/autism  Yes, she has a niece with autism 36. Other inherited genetic or chromosomal disorder  no 15. Maternal metabolic disorder (DM, PKU, etc)  no 16. Patient or FOB with a child with a birth defect not listed above no  16a. Patient or FOB with a birth defect themselves no 17. Recurrent pregnancy loss, or stillbirth  no  18. Any medications since LMP other than prenatal vitamins (include vitamins, supplements, OTC meds, drugs, alcohol)  Yes, MJ 19. Any other genetic/environmental exposure to discuss  no  Infection History:   1. Lives with someone with TB or TB exposed  no  2. Patient or partner has history of genital herpes  Yes- she has HSV and has used Valtrex in the past. 3. Rash or viral illness since LMP  no 4. History of STI (GC, CT, HPV, syphilis, HIV)  no 5. History of recent travel :  no  Other pertinent information:  no     Review of Systems:10 point review of systems negative unless otherwise noted in HPI  Past Medical History:  Past Medical History:  Diagnosis Date  . Herpes   . HSV-2 infection     Past Surgical History:  History reviewed. No pertinent surgical history.  Gynecologic History: Patient's last menstrual period was 06/25/2020.  Obstetric History: G2P1001  Family History:  Family History  Problem Relation Age of Onset  . Hypertension Mother   .  Diabetes Maternal Uncle   . Diabetes Other     Social History:  Social History   Socioeconomic History  . Marital status: Single    Spouse name: Not on file  . Number of children: Not on file  . Years of education: Not on file  . Highest education level: Not on file  Occupational History  . Not on file  Tobacco Use  . Smoking status: Never Smoker  . Smokeless tobacco: Never Used  Substance and Sexual Activity  . Alcohol use: Never  . Drug use: Yes    Types: Marijuana  . Sexual activity: Yes  Other Topics Concern  . Not on file  Social History Narrative  . Not on file    Social Determinants of Health   Financial Resource Strain: Not on file  Food Insecurity: Not on file  Transportation Needs: Not on file  Physical Activity: Not on file  Stress: Not on file  Social Connections: Not on file  Intimate Partner Violence: Not on file    Allergies:  Allergies  Allergen Reactions  . Codeine     Medications: Prior to Admission medications   Medication Sig Start Date End Date Taking? Authorizing Provider  Prenatal MV & Min w/FA-DHA (PRENATAL ADULT GUMMY/DHA/FA PO) Take by mouth.   Yes [provider]  ValACYclovir HCl (VALTREX PO) Take by mouth.   Yes [provider]    Physical Exam Vitals: Blood pressure 120/70, weight 187 lb (84.8 kg), last menstrual period 06/25/2020.  General: NAD HEENT: normocephalic, anicteric Thyroid: no enlargement, no palpable nodules Pulmonary: No increased work of breathing, CTAB Cardiovascular: RRR, distal pulses 2+ Abdomen: NABS, soft, non-tender, non-distended.  Umbilicus without lesions.  No hepatomegaly, splenomegaly or masses palpable. No evidence of hernia  Genitourinary:  External: Normal external female genitalia.  Normal urethral meatus, normal  Bartholin's and Skene's glands.    Vagina: Normal vaginal mucosa, no evidence of prolapse.    Cervix: Grossly normal in appearance, no bleeding  Uterus: retroverted Non-enlarged, mobile, normal contour.  No CMT  Adnexa: ovaries non-enlarged, no adnexal masses  Rectal: deferred Extremities: no edema, erythema, or tenderness Neurologic: Grossly intact Psychiatric: mood appropriate, affect full   Assessment: 25 y.o. G2P1001 at [redacted]w[redacted]d presenting to initiate prenatal care  Plan: 1) Avoid alcoholic beverages. 2) Patient encouraged not to smoke.  3) Discontinue the use of all non-medicinal drugs and chemicals.  4) Take prenatal vitamins daily.  5) Nutrition, food safety (fish, cheese advisories, and high nitrite foods) and exercise discussed. 6)  Hospital and practice style discussed with cross coverage system.  7) Genetic Screening, such as with 1st Trimester Screening, cell free fetal DNA, AFP testing, and Ultrasound, as well as with amniocentesis and CVS as appropriate, is discussed with patient. At the conclusion of today's visit patient requested genetic testing 8) Patient is asked about travel to areas at risk for the Zika virus, and counseled to avoid travel and exposure to mosquitoes or sexual partners who may have themselves been exposed to the virus. Testing is discussed, and will be ordered as appropriate.  She desires the MaternT test Discussed breastfeeding benefits and nursing is encouraged. F/U in 4 weeks for labs and MaternT test and ROB. Had a dating scan at ED. Mirna Mires, CNM  08/13/2020 3:56 PM

## 2020-08-15 LAB — CULTURE, OB URINE

## 2020-08-15 LAB — URINE CULTURE, OB REFLEX

## 2020-08-17 LAB — CYTOLOGY - PAP
Chlamydia: NEGATIVE
Comment: NEGATIVE
Comment: NEGATIVE
Comment: NORMAL
Diagnosis: NEGATIVE
Neisseria Gonorrhea: NEGATIVE
Trichomonas: NEGATIVE

## 2020-08-22 LAB — URINE DRUG PANEL 7
Amphetamines, Urine: NEGATIVE ng/mL
Barbiturate Quant, Ur: NEGATIVE ng/mL
Benzodiazepine Quant, Ur: NEGATIVE ng/mL
Cannabinoid Quant, Ur: POSITIVE — AB
Cocaine (Metab.): NEGATIVE ng/mL
Opiate Quant, Ur: NEGATIVE ng/mL
PCP Quant, Ur: NEGATIVE ng/mL

## 2020-08-25 ENCOUNTER — Encounter: Payer: Self-pay | Admitting: Obstetrics

## 2020-08-25 DIAGNOSIS — F1290 Cannabis use, unspecified, uncomplicated: Secondary | ICD-10-CM | POA: Insufficient documentation

## 2020-09-11 ENCOUNTER — Encounter: Payer: Self-pay | Admitting: Obstetrics and Gynecology

## 2020-09-11 ENCOUNTER — Other Ambulatory Visit: Payer: Self-pay

## 2020-09-11 ENCOUNTER — Ambulatory Visit (INDEPENDENT_AMBULATORY_CARE_PROVIDER_SITE_OTHER): Payer: Medicaid Other | Admitting: Obstetrics and Gynecology

## 2020-09-11 VITALS — BP 120/72 | Ht 61.5 in | Wt 188.0 lb

## 2020-09-11 DIAGNOSIS — Z3481 Encounter for supervision of other normal pregnancy, first trimester: Secondary | ICD-10-CM

## 2020-09-11 DIAGNOSIS — Z1379 Encounter for other screening for genetic and chromosomal anomalies: Secondary | ICD-10-CM

## 2020-09-11 DIAGNOSIS — Z130 Encounter for screening for diseases of the blood and blood-forming organs and certain disorders involving the immune mechanism: Secondary | ICD-10-CM

## 2020-09-11 DIAGNOSIS — Z13228 Encounter for screening for other metabolic disorders: Secondary | ICD-10-CM

## 2020-09-11 DIAGNOSIS — Z3A11 11 weeks gestation of pregnancy: Secondary | ICD-10-CM

## 2020-09-11 LAB — POCT URINALYSIS DIPSTICK OB
Glucose, UA: NEGATIVE
POC,PROTEIN,UA: NEGATIVE

## 2020-09-11 NOTE — Progress Notes (Signed)
Routine Prenatal Care Visit  Subjective  Meredith Hubbard is a 24 y.o. G2P1001 at [redacted]w[redacted]d being seen today for ongoing prenatal care.  She is currently monitored for the following issues for this low-risk pregnancy and has Supervision of other normal pregnancy, antepartum and Marijuana use on their problem list.  ----------------------------------------------------------------------------------- Patient reports no complaints.   Contractions: Not present. Vag. Bleeding: None.  Movement: Absent. Denies leaking of fluid.  ----------------------------------------------------------------------------------- The following portions of the patient's history were reviewed and updated as appropriate: allergies, current medications, past family history, past medical history, past social history, past surgical history and problem list. Problem list updated.   Objective  Blood pressure 120/72, height 5' 1.5" (1.562 m), weight 188 lb (85.3 kg), last menstrual period 06/25/2020. Pregravid weight 185 lb (83.9 kg) Total Weight Gain 3 lb (1.361 kg) Urinalysis:      Fetal Status: Fetal Heart Rate (bpm): 140   Movement: Absent     General:  Alert, oriented and cooperative. Patient is in no acute distress.  Skin: Skin is warm and dry. No rash noted.   Cardiovascular: Normal heart rate noted  Respiratory: Normal respiratory effort, no problems with respiration noted  Abdomen: Soft, gravid, appropriate for gestational age. Pain/Pressure: Absent     Pelvic:  Cervical exam deferred        Extremities: Normal range of motion.  Edema: None  Mental Status: Normal mood and affect. Normal behavior. Normal judgment and thought content.     Assessment   25 y.o. G2P1001 at [redacted]w[redacted]d by  03/31/2021, by Ultrasound presenting for routine prenatal visit  Plan   pregnancy2  Problems (from 05/16/20 to present)    Problem Noted Resolved   Marijuana use 08/25/2020 by Mirna Mires, CNM No   Overview Signed 08/25/2020 10:45 AM  by Mirna Mires, CNM    07/2020- tested poisitive for MJ in early pregnancy. Advised to cease using during the pregnancy.      Supervision of other normal pregnancy, antepartum 08/13/2020 by Mirna Mires, CNM No   Overview Addendum 08/17/2020  5:00 PM by Mirna Mires, CNM     Clinic  Prenatal Labs  Dating  Blood type: --/--/AB POS Performed at [facility], [address] [place], [place] [address]  [phone])   Genetic Screen 1 Screen:    AFP:     Quad:     NIPS: Antibody:   Anatomic [place]  Rubella:    GTT Early:               Third trimester:  RPR:     Flu vaccine  HBsAg:     TDaP vaccine                                               Rhogam: HIV:     Baby Food                                               GBS: (For PCN allergy, check sensitivities)  Contraception  LKG:MWNU  Circumcision    Pediatrician    Support Person              Previous Version  Reviewed management of nausea in pregnancy. NOB labs today.  Gestational age appropriate obstetric precautions including but not limited to vaginal bleeding, contractions, leaking of fluid and fetal movement were reviewed in detail with the patient.    Return in about 4 weeks (around 10/09/2020) for ROB in person.  Natale Milch MD [facility], [facility] 09/11/2020, 4:45 PM

## 2020-09-11 NOTE — Patient Instructions (Signed)
Initial steps to help :   B6 (pyridoxine) 25 mg,  3-4 times a day- 200 mg a day total Unisom (doxylamine) 25 mg at bedtime **B6 and Unisom are available as a combination prescription medications called diclegis and bonjesta  B1 (thiamin)  50-100 mg 1-2 a day-  100 mg a day total  Continue prenatal vitamin with iron and thiamin. If it is not tolerated switch to 1 mg of folic acid.  Can add medication for gastric reflux if needed.  Subsequent steps to be added to B1, B6, and Unisom:  1. Antihistamine (one of the following medications) Dramamine      25-50 mg every 4-6 hours Benadryl      25-50 mg every 4-6 hours Meclizine      25 mg every 6 hours  2. Dopamine Antagonist (one of the following medications) Metoclopramide  (Reglan)  5-10 mg every 6-8 hours         PO Promethazine   (Phenergan)   12.5-25 mg every 4-6 hours      PO or rectal Prochlorperazine  (Compazine)  5-10 mg every 6-8 hours     25mg  BID rectally   Subsequent steps if there has still not been improvement in symptoms:  3. Daily stool softner:  Colace 100 mg twice a day  4. Ondansetron  (Zofran)   4-8 mg every 6-8 hours   Hyperemesis Gravidarum Hyperemesis gravidarum is a severe form of nausea and vomiting that happens during pregnancy. Hyperemesis is worse than morning sickness. It may cause you to have nausea or vomiting all day for many days. It may keep you from eating and drinking enough food and liquids, which can lead to dehydration, malnutrition, and weight loss. Hyperemesis usually occurs during the first half (the first 20 weeks) of pregnancy. It often goes away once a woman is in her second half of pregnancy. However, sometimes hyperemesis continues through an entire pregnancy. What are the causes? The cause of this condition is not known. It may be associated with:  Changes in hormones in the body during pregnancy.  Changes in the gastrointestinal system.  Genetic or inherited conditions. What are the  signs or symptoms? Symptoms of this condition include:  Severe nausea and vomiting that does not go away.  Problems keeping food down.  Weight loss.  Loss of body fluid (dehydration).  Loss of appetite. You may have no desire to eat or you may not like the food you have previously enjoyed. How is this diagnosed? This condition may be diagnosed based on your medical history, your symptoms, and a physical exam. You may also have other tests, including:  Blood tests.  Urine tests.  Blood pressure tests.  Ultrasound to look for problems with the placenta or to check if you are pregnant with more than one baby. How is this treated? This condition is managed by controlling symptoms. This may include:  Following an eating plan. This can help to lessen nausea and vomiting.  Treatments that do not use medicine. These include acupressure bracelets, hypnosis, and eating or drinking foods or fluids that contain ginger, ginger ale, or ginger tea.  Taking prescription medicine or over-the-counter medicine as told by your health care provider.  Continuing to take prenatal vitamins. You may need to change what kind you take and when you take them. Follow your health care provider's instructions about prenatal vitamins. An eating plan and medicines are often used together to help control symptoms. If medicines do not help relieve nausea  and vomiting, you may need to receive fluids through an IV at the hospital. Follow these instructions at home: To help relieve your symptoms, listen to your body. Everyone is different and has different preferences. Find what works best for you. Here are some things you can try to help relieve your symptoms: Meals and snacks  Eat 5-6 small meals daily instead of 3 large meals. Eating small meals and snacks can help you avoid an empty stomach.  Before getting out of bed, eat a couple of crackers to avoid moving around on an empty stomach.  Eat a protein-rich  snack before bed. Examples include cheese and crackers, or a peanut butter sandwich made with 1 slice of whole-wheat bread and 1 tsp (5 g) of peanut butter.  Eat and drink slowly.  Try eating starchy foods as these are usually tolerated well. Examples include cereal, toast, bread, potatoes, pasta, rice, and pretzels.  Eat at least one serving of protein with your meals and snacks. Protein options include lean meats, poultry, seafood, beans, nuts, nut butters, eggs, cheese, and yogurt.  Eat or suck on things that have ginger in them. It may help to relieve nausea. Add  tsp (0.44 g) ground ginger to hot tea, or choose ginger tea.   Fluids It is important to stay hydrated. Try to:  Drink small amounts of fluids often.  Drink fluids 30 minutes before or after a meal to help lessen the feeling of a full stomach.  Drink 100% fruit juice or an electrolyte drink. An electrolyte drink contains sodium, potassium, and chloride.  Drink fluids that are cold, clear, and carbonated or sour. These include lemonade, ginger ale, lemon-lime soda, ice water, and sparkling water. Things to avoid Avoid the following:  Eating foods that trigger your symptoms. These may include spicy foods, coffee, high-fat foods, very sweet foods, and acidic foods.  Drinking more than 1 cup of fluid at a time.  Skipping meals. Nausea can be more intense on an empty stomach. If you cannot tolerate food, do not force it. Try sucking on ice chips or other frozen items and make up for missed calories later.  Lying down within 2 hours after eating.  Being exposed to environmental triggers. These may include food smells, smoky rooms, closed spaces, rooms with strong smells, warm or humid places, overly loud and noisy rooms, and rooms with motion or flickering lights. Try eating meals in a well-ventilated area that is free of strong smells.  Making quick and sudden changes in your movement.  Taking iron pills and multivitamins  that contain iron. If you take prescription iron pills, do not stop taking them unless your health care provider approves.  Preparing food. The smell of food can spoil your appetite or trigger nausea. General instructions  Brush your teeth or use a mouth rinse after meals.  Take over-the-counter and prescription medicines only as told by your health care provider.  Follow instructions from your health care provider about eating or drinking restrictions.  Talk with your health care provider about starting a supplement of vitamin B6.  Continue to take your prenatal vitamins as told by your health care provider. If you are having trouble taking your prenatal vitamins, talk with your health care provider about other options.  Keep all follow-up visits. This is important. Follow-up visits include prenatal visits. Contact a health care provider if:  You have pain in your abdomen.  You have a severe headache.  You have vision problems.  You are  losing weight.  You feel weak or dizzy.  You cannot eat or drink without vomiting, especially if this goes on for a full day. Get help right away if:  You cannot drink fluids without vomiting.  You vomit blood.  You have constant nausea and vomiting.  You are very weak.  You faint.  You have a fever and your symptoms suddenly get worse. Summary  Hyperemesis gravidarum is a severe form of nausea and vomiting that happens during pregnancy.  Making some changes to your eating habits may help relieve nausea and vomiting.  This condition may be managed with lifestyle changes and medicines as prescribed by your health care provider.  If medicines do not help relieve nausea and vomiting, you may need to receive fluids through an IV at the hospital. This information is not intended to replace advice given to you by your health care provider. Make sure you discuss any questions you have with your health care provider. Document Revised:  11/04/2019 Document Reviewed: 11/04/2019 Elsevier Patient Education  2021 [employer].

## 2020-09-12 LAB — MATERNIT21 PLUS CORE+SCA

## 2020-09-14 LAB — RPR+RH+ABO+RUB AB+AB SCR+CB...
Antibody Screen: NEGATIVE
HIV Screen 4th Generation wRfx: NONREACTIVE
Hematocrit: 33.2 % — ABNORMAL LOW (ref 34.0–46.6)
Hemoglobin: 11.1 g/dL (ref 11.1–15.9)
Hepatitis B Surface Ag: NEGATIVE
MCH: 30.1 pg (ref 26.6–33.0)
MCHC: 33.4 g/dL (ref 31.5–35.7)
MCV: 90 fL (ref 79–97)
Platelets: 273 10*3/uL (ref 150–450)
RBC: 3.69 x10E6/uL — ABNORMAL LOW (ref 3.77–5.28)
RDW: 13.1 % (ref 11.7–15.4)
RPR Ser Ql: NONREACTIVE
Rh Factor: POSITIVE
Rubella Antibodies, IGG: 1.63 index (ref >0.99)
Varicella zoster IgG: 577 index (ref >165)
WBC: 12.9 10*3/uL — ABNORMAL HIGH (ref 3.4–10.8)

## 2020-09-14 LAB — HGB FRACTIONATION CASCADE
Hgb A2: 2.6 % (ref 1.8–3.2)
Hgb A: 97.4 % (ref 96.4–98.8)
Hgb F: 0 % (ref 0.0–2.0)
Hgb S: 0 %

## 2020-09-14 LAB — HEPATITIS C ANTIBODY: Hep C Virus Ab: <0.1 s/co ratio (ref 0.0–0.9)

## 2020-09-22 LAB — INHERITEST CORE(CF97,SMA,FRAX)

## 2020-09-24 NOTE — Telephone Encounter (Signed)
I would ask Victorino Dike or Andrey Campanile about the issue with her lab sample, She likely just needs to be scheduled for a redraw

## 2020-09-25 NOTE — Telephone Encounter (Signed)
Pt notified by MyChart of need to redraw.  Adv can sched appt c lab here or wait for her next OB appointment.  Andrey Campanile is under the impression it is still being investigated by LC.

## 2020-10-09 ENCOUNTER — Other Ambulatory Visit: Payer: Self-pay

## 2020-10-09 ENCOUNTER — Ambulatory Visit (INDEPENDENT_AMBULATORY_CARE_PROVIDER_SITE_OTHER): Payer: Medicaid Other | Admitting: Obstetrics and Gynecology

## 2020-10-09 VITALS — BP 118/68 | Wt 191.0 lb

## 2020-10-09 DIAGNOSIS — Z3689 Encounter for other specified antenatal screening: Secondary | ICD-10-CM

## 2020-10-09 DIAGNOSIS — Z3480 Encounter for supervision of other normal pregnancy, unspecified trimester: Secondary | ICD-10-CM

## 2020-10-09 DIAGNOSIS — Z3400 Encounter for supervision of normal first pregnancy, unspecified trimester: Secondary | ICD-10-CM

## 2020-10-09 DIAGNOSIS — Z3A15 15 weeks gestation of pregnancy: Secondary | ICD-10-CM

## 2020-10-09 NOTE — Progress Notes (Signed)
Routine Prenatal Care Visit  Subjective  Meredith Hubbard is a 25 y.o. G2P1001 at [redacted]w[redacted]d being seen today for ongoing prenatal care.  She is currently monitored for the following issues for this low-risk pregnancy and has Supervision of other normal pregnancy, antepartum and Marijuana use on their problem list.  ----------------------------------------------------------------------------------- Patient reports no complaints.   Contractions: Not present. Vag. Bleeding: None.  Movement: Absent. Denies leaking of fluid.  ----------------------------------------------------------------------------------- The following portions of the patient's history were reviewed and updated as appropriate: allergies, current medications, past family history, past medical history, past social history, past surgical history and problem list. Problem list updated.   Objective  Blood pressure 118/68, weight 191 lb (86.6 kg), last menstrual period 06/25/2020. Pregravid weight 185 lb (83.9 kg) Total Weight Gain 6 lb (2.722 kg) Urinalysis:      Fetal Status: Fetal Heart Rate (bpm): 150   Movement: Absent     General:  Alert, oriented and cooperative. Patient is in no acute distress.  Skin: Skin is warm and dry. No rash noted.   Cardiovascular: Normal heart rate noted  Respiratory: Normal respiratory effort, no problems with respiration noted  Abdomen: Soft, gravid, appropriate for gestational age. Pain/Pressure: Absent     Pelvic:  Cervical exam deferred        Extremities: Normal range of motion.  Edema: None  ental Status: Normal mood and affect. Normal behavior. Normal judgment and thought content.     Assessment   25 y.o. G2P1001 at [redacted]w[redacted]d by  03/31/2021, by Ultrasound presenting for routine prenatal visit  Plan   pregnancy2  Problems (from 05/16/20 to present)     Problem Noted Resolved   Marijuana use 08/25/2020 by Mirna Mires, CNM No   Overview Signed 08/25/2020 10:45 AM by Mirna Mires,  CNM    07/2020- tested poisitive for MJ in early pregnancy. Advised to cease using during the pregnancy.       Supervision of other normal pregnancy, antepartum 08/13/2020 by Mirna Mires, CNM No   Overview Addendum 10/09/2020 11:32 AM by Zipporah Plants, CNM     Nursing Staff Provider  Office Location  [facility] Dating   6 wk [place]  Language  English Anatomy US   ordered  Flu Vaccine   Genetic Screen  NIPS: declined  TDaP vaccine    Hgb A1C or  GTT Early : n/a Third trimester :   Covid  vax x2   LAB RESULTS   Rhogam   n/a Blood Type AB/Positive/-- (05/20 1624)   Feeding Plan  Breast Antibody Negative (05/20 1624)  Contraception  Rubella 1.63 (05/20 1624)  Circumcision  RPR Non Reactive (05/20 1624)   Pediatrician   HBsAg Negative (05/20 1624)   Support Person  Feliz Beam HIV Non Reactive (05/20 1624)  Prenatal Classes  Declined Varicella  Immune    GBS  (For PCN allergy, check sensitivities)   BTL Consent  N/A    VBAC Consent  N/A Pap  NILM    Hgb Electro   normal hbg    CF  Negative     SMA  Negative                 -Declined recollection of NIPS -Anatomy scan ordered  Second trimester obstetric precautions including but not limited to vaginal bleeding, contractions, leaking of fluid and fetal movement were reviewed in detail with the patient.    Return in about 4 weeks (around 11/06/2020) for ROB - anatomy US order  placed for [facility] in four weeks.  Zipporah Plants, CNM, MSN [facility], [facility] 10/09/2020, 11:33 AM

## 2020-11-04 ENCOUNTER — Other Ambulatory Visit: Payer: Self-pay

## 2020-11-04 ENCOUNTER — Ambulatory Visit
Admission: RE | Admit: 2020-11-04 | Discharge: 2020-11-04 | Disposition: A | Discharge status: Home / Self Care | Payer: Medicaid Other | Source: Ambulatory Visit | Attending: Obstetrics and Gynecology | Admitting: Obstetrics and Gynecology

## 2020-11-04 DIAGNOSIS — Z3689 Encounter for other specified antenatal screening: Secondary | ICD-10-CM | POA: Diagnosis not present

## 2020-11-06 ENCOUNTER — Other Ambulatory Visit: Payer: Self-pay

## 2020-11-06 ENCOUNTER — Encounter: Payer: Self-pay | Admitting: Obstetrics and Gynecology

## 2020-11-06 ENCOUNTER — Ambulatory Visit (INDEPENDENT_AMBULATORY_CARE_PROVIDER_SITE_OTHER): Payer: Medicaid Other | Admitting: Obstetrics and Gynecology

## 2020-11-06 VITALS — BP 120/80 | Wt 194.0 lb

## 2020-11-06 DIAGNOSIS — Z3A19 19 weeks gestation of pregnancy: Secondary | ICD-10-CM

## 2020-11-06 DIAGNOSIS — Z3482 Encounter for supervision of other normal pregnancy, second trimester: Secondary | ICD-10-CM

## 2020-11-06 NOTE — Progress Notes (Signed)
Routine Prenatal Care Visit  Subjective  Meredith Hubbard is a 25 y.o. G2P1001 at [redacted]w[redacted]d being seen today for ongoing prenatal care.  She is currently monitored for the following issues for this low-risk pregnancy and has Supervision of other normal pregnancy, antepartum and Marijuana use on their problem list.  ----------------------------------------------------------------------------------- Patient reports no complaints.   Contractions: Not present. Vag. Bleeding: None.  Movement: Present. Leaking Fluid denies.  ----------------------------------------------------------------------------------- The following portions of the patient's history were reviewed and updated as appropriate: allergies, current medications, past family history, past medical history, past social history, past surgical history and problem list. Problem list updated.  Objective  Blood pressure 120/80, weight 194 lb (88 kg), last menstrual period 06/25/2020. Pregravid weight 185 lb (83.9 kg) Total Weight Gain 9 lb (4.082 kg) Urinalysis: Urine Protein    Urine Glucose    Fetal Status: Fetal Heart Rate (bpm): 150   Movement: Present     General:  Alert, oriented and cooperative. Patient is in no acute distress.  Skin: Skin is warm and dry. No rash noted.   Cardiovascular: Normal heart rate noted  Respiratory: Normal respiratory effort, no problems with respiration noted  Abdomen: Soft, gravid, appropriate for gestational age. Pain/Pressure: Present     Pelvic:  Cervical exam deferred        Extremities: Normal range of motion.  Edema: None  Mental Status: Normal mood and affect. Normal behavior. Normal judgment and thought content.   Assessment   25 y.o. G2P1001 at [redacted]w[redacted]d by  03/31/2021, by Ultrasound presenting for routine prenatal visit  Plan   pregnancy2  Problems (from 05/16/20 to present)     Problem Noted Resolved   Marijuana use 08/25/2020 by Mirna Mires, CNM No   Overview Signed 08/25/2020 10:45 AM by  Mirna Mires, CNM    07/2020- tested poisitive for MJ in early pregnancy. Advised to cease using during the pregnancy.       Supervision of other normal pregnancy, antepartum 08/13/2020 by Mirna Mires, CNM No   Overview Addendum 11/06/2020  9:21 AM by Conard Novak, MD     Nursing Staff Provider  Office Location  [facility] Dating   6 wk [place]  Language  English Anatomy US   incomplete, follow up ordered  Flu Vaccine   Genetic Screen  NIPS: declined  TDaP vaccine    Hgb A1C or  GTT Early : n/a Third trimester :   Covid  vax x2   LAB RESULTS   Rhogam   n/a Blood Type AB/Positive/-- (05/20 1624)   Feeding Plan  Breast Antibody Negative (05/20 1624)  Contraception  Rubella 1.63 (05/20 1624)  Circumcision  RPR Non Reactive (05/20 1624)   Pediatrician   HBsAg Negative (05/20 1624)   Support Person  Feliz Beam HIV Non Reactive (05/20 1624)  Prenatal Classes  Declined Varicella  Immune    GBS  (For PCN allergy, check sensitivities)   BTL Consent  N/A    VBAC Consent  N/A Pap  NILM    Hgb Electro   normal hbg    CF  Negative     SMA  Negative                  Preterm labor symptoms and general obstetric precautions including but not limited to vaginal bleeding, contractions, leaking of fluid and fetal movement were reviewed in detail with the patient. Please refer to After Visit Summary for other counseling recommendations.   - anatomy u/s incomplete  at hospital for spine and nose/upper lip.  DIscussed with patient and follow up scan ordered.   Return in about 4 weeks (around 12/04/2020) for Routine Prenatal Appointment.   Thomasene Mohair, MD, [facility], [facility] 11/06/2020 9:21 AM

## 2020-12-04 ENCOUNTER — Other Ambulatory Visit: Payer: Self-pay

## 2020-12-04 ENCOUNTER — Encounter: Payer: Self-pay | Admitting: Advanced Practice Midwife

## 2020-12-04 ENCOUNTER — Ambulatory Visit (INDEPENDENT_AMBULATORY_CARE_PROVIDER_SITE_OTHER): Payer: Medicaid Other | Admitting: Advanced Practice Midwife

## 2020-12-04 VITALS — BP 90/60 | Wt 194.0 lb

## 2020-12-04 DIAGNOSIS — Z3482 Encounter for supervision of other normal pregnancy, second trimester: Secondary | ICD-10-CM

## 2020-12-04 DIAGNOSIS — Z113 Encounter for screening for infections with a predominantly sexual mode of transmission: Secondary | ICD-10-CM

## 2020-12-04 DIAGNOSIS — Z369 Encounter for antenatal screening, unspecified: Secondary | ICD-10-CM

## 2020-12-04 DIAGNOSIS — Z131 Encounter for screening for diabetes mellitus: Secondary | ICD-10-CM

## 2020-12-04 DIAGNOSIS — Z130 Encounter for screening for diseases of the blood and blood-forming organs and certain disorders involving the immune mechanism: Secondary | ICD-10-CM

## 2020-12-04 DIAGNOSIS — Z3A23 23 weeks gestation of pregnancy: Secondary | ICD-10-CM

## 2020-12-04 LAB — POCT URINALYSIS DIPSTICK OB
Glucose, UA: NEGATIVE
POC,PROTEIN,UA: NEGATIVE

## 2020-12-04 NOTE — Addendum Note (Signed)
Addended by: Donnetta Hail on: 12/04/2020 09:09 AM   Modules accepted: Orders

## 2020-12-04 NOTE — Progress Notes (Signed)
ROB- ankle swelling when stands for long periods x 3 weeks

## 2020-12-04 NOTE — Patient Instructions (Signed)

## 2020-12-04 NOTE — Progress Notes (Signed)
Routine Prenatal Care Visit  Subjective  Meredith Hubbard is a 25 y.o. G2P1001 at [redacted]w[redacted]d being seen today for ongoing prenatal care.  She is currently monitored for the following issues for this low-risk pregnancy and has Supervision of other normal pregnancy, antepartum and Marijuana use on their problem list.  ----------------------------------------------------------------------------------- Patient reports some lower extremity swelling when standing too long.   Contractions: Not present. Vag. Bleeding: None.  Movement: Present. Leaking Fluid denies.  ----------------------------------------------------------------------------------- The following portions of the patient's history were reviewed and updated as appropriate: allergies, current medications, past family history, past medical history, past social history, past surgical history and problem list. Problem list updated.  Objective  Blood pressure 90/60, weight 194 lb (88 kg), last menstrual period 06/25/2020. Pregravid weight 185 lb (83.9 kg) Total Weight Gain 9 lb (4.082 kg) Urinalysis: Urine Protein    Urine Glucose    Fetal Status: Fetal Heart Rate (bpm): 144 Fundal Height: 24 cm Movement: Present     General:  Alert, oriented and cooperative. Patient is in no acute distress.  Skin: Skin is warm and dry. No rash noted.   Cardiovascular: Normal heart rate noted  Respiratory: Normal respiratory effort, no problems with respiration noted  Abdomen: Soft, gravid, appropriate for gestational age. Pain/Pressure: Absent     Pelvic:  Cervical exam deferred        Extremities: Normal range of motion.  Edema: None  Mental Status: Normal mood and affect. Normal behavior. Normal judgment and thought content.   Assessment   25 y.o. G2P1001 at [redacted]w[redacted]d by  03/31/2021, by Ultrasound presenting for routine prenatal visit  Plan   pregnancy2  Problems (from 05/16/20 to present)    Problem Noted Resolved   Marijuana use 08/25/2020 by Mirna Mires, CNM No   Overview Signed 08/25/2020 10:45 AM by Mirna Mires, CNM    07/2020- tested poisitive for MJ in early pregnancy. Advised to cease using during the pregnancy.      Supervision of other normal pregnancy, antepartum 08/13/2020 by Mirna Mires, CNM No   Overview Addendum 11/06/2020  9:21 AM by Conard Novak, MD     Nursing Staff Provider  Office Location  [facility] Dating   6 wk [place]  Language  English Anatomy US   incomplete, follow up ordered  Flu Vaccine   Genetic Screen  NIPS: declined  TDaP vaccine    Hgb A1C or  GTT Early : n/a Third trimester :   Covid  vax x2   LAB RESULTS   Rhogam   n/a Blood Type AB/Positive/-- (05/20 1624)   Feeding Plan  Breast Antibody Negative (05/20 1624)  Contraception  Rubella 1.63 (05/20 1624)  Circumcision  RPR Non Reactive (05/20 1624)   Pediatrician   HBsAg Negative (05/20 1624)   Support Person  Feliz Beam HIV Non Reactive (05/20 1624)  Prenatal Classes  Declined Varicella  Immune    GBS  (For PCN allergy, check sensitivities)   BTL Consent  N/A    VBAC Consent  N/A Pap  NILM    Hgb Electro   normal hbg    CF  Negative     SMA  Negative                  Preterm labor symptoms and general obstetric precautions including but not limited to vaginal bleeding, contractions, leaking of fluid and fetal movement were reviewed in detail with the patient. Please refer to After Visit Summary for other counseling recommendations.  Return in about 4 weeks (around 01/01/2021) for 28 wk labs and rob.  Tresea Mall, CNM 12/04/2020 9:06 AM

## 2020-12-09 ENCOUNTER — Ambulatory Visit
Admission: RE | Admit: 2020-12-09 | Discharge: 2020-12-09 | Disposition: A | Discharge status: Home / Self Care | Payer: Medicaid Other | Source: Ambulatory Visit | Attending: Obstetrics and Gynecology | Admitting: Obstetrics and Gynecology

## 2020-12-09 ENCOUNTER — Other Ambulatory Visit: Payer: Self-pay

## 2020-12-09 DIAGNOSIS — Z3482 Encounter for supervision of other normal pregnancy, second trimester: Secondary | ICD-10-CM | POA: Insufficient documentation

## 2020-12-10 ENCOUNTER — Ambulatory Visit (INDEPENDENT_AMBULATORY_CARE_PROVIDER_SITE_OTHER): Payer: Medicaid Other | Admitting: Obstetrics

## 2020-12-10 VITALS — BP 102/70 | Wt 197.0 lb

## 2020-12-10 DIAGNOSIS — Z3482 Encounter for supervision of other normal pregnancy, second trimester: Secondary | ICD-10-CM

## 2020-12-10 DIAGNOSIS — Z3A24 24 weeks gestation of pregnancy: Secondary | ICD-10-CM

## 2020-12-10 DIAGNOSIS — Z3480 Encounter for supervision of other normal pregnancy, unspecified trimester: Secondary | ICD-10-CM

## 2020-12-10 LAB — POCT URINALYSIS DIPSTICK OB
Glucose, UA: NEGATIVE
POC,PROTEIN,UA: NEGATIVE

## 2020-12-10 NOTE — Addendum Note (Signed)
Addended by: Cornelius Moras D on: 12/10/2020 02:27 PM   Modules accepted: Orders

## 2020-12-10 NOTE — Progress Notes (Signed)
Routine Prenatal Care Visit  Subjective  Meredith Hubbard is a 25 y.o. G2P1001 at [redacted]w[redacted]d being seen today for ongoing prenatal care.  She is currently monitored for the following issues for this low-risk pregnancy and has Supervision of other normal pregnancy, antepartum and Marijuana use on their problem list.  ----------------------------------------------------------------------------------- Patient reports feeling like hr clitoris is swollen. Has had some pressure..   Contractions: Not present. Vag. Bleeding: None.  Movement: Present. Leaking Fluid denies.  ----------------------------------------------------------------------------------- The following portions of the patient's history were reviewed and updated as appropriate: allergies, current medications, past family history, past medical history, past social history, past surgical history and problem list. Problem list updated.  Objective  Blood pressure 102/70, weight 197 lb (89.4 kg), last menstrual period 06/25/2020. Pregravid weight 185 lb (83.9 kg) Total Weight Gain 12 lb (5.443 kg) Urinalysis: Urine Protein    Urine Glucose    Fetal Status:     Movement: Present     General:  Alert, oriented and cooperative. Patient is in no acute distress.  Skin: Skin is warm and dry. No rash noted.   Cardiovascular: Normal heart rate noted  Respiratory: Normal respiratory effort, no problems with respiration noted  Abdomen: Soft, gravid, appropriate for gestational age. Pain/Pressure: Absent     Pelvic:  Cervical exam deferred        Extremities: Normal range of motion.     Mental Status: Normal mood and affect. Normal behavior. Normal judgment and thought content.   Assessment   25 y.o. G2P1001 at [redacted]w[redacted]d by  03/31/2021, by Ultrasound presenting for routine prenatal visit  Plan   pregnancy2  Problems (from 05/16/20 to present)    Problem Noted Resolved   Marijuana use 08/25/2020 by Mirna Mires, CNM No   Overview Signed 08/25/2020  10:45 AM by Mirna Mires, CNM    07/2020- tested poisitive for MJ in early pregnancy. Advised to cease using during the pregnancy.      Supervision of other normal pregnancy, antepartum 08/13/2020 by Mirna Mires, CNM No   Overview Addendum 11/06/2020  9:21 AM by Conard Novak, MD     Nursing Staff Provider  Office Location  [facility] Dating   6 wk [place]  Language  English Anatomy US   incomplete, follow up ordered  Flu Vaccine   Genetic Screen  NIPS: declined  TDaP vaccine    Hgb A1C or  GTT Early : n/a Third trimester :   Covid  vax x2   LAB RESULTS   Rhogam   n/a Blood Type AB/Positive/-- (05/20 1624)   Feeding Plan  Breast Antibody Negative (05/20 1624)  Contraception  Rubella 1.63 (05/20 1624)  Circumcision  RPR Non Reactive (05/20 1624)   Pediatrician   HBsAg Negative (05/20 1624)   Support Person  Feliz Beam HIV Non Reactive (05/20 1624)  Prenatal Classes  Declined Varicella  Immune    GBS  (For PCN allergy, check sensitivities)   BTL Consent  N/A    VBAC Consent  N/A Pap  NILM    Hgb Electro   normal hbg    CF  Negative     SMA  Negative                  Preterm labor symptoms and general obstetric precautions including but not limited to vaginal bleeding, contractions, leaking of fluid and fetal movement were reviewed in detail with the patient. Please refer to After Visit Summary for other counseling recommendations.  Exteranl examination  preformed. Her labia and clitoris are WNL- no edema, irritation  Noted. Pt is reassured. Discussed ehr 28 weeks labs for next visit.  Return in about 4 weeks (around 01/07/2021) for return OB, glucose testing and labs.  Mirna Mires, CNM  12/10/2020 2:11 PM

## 2020-12-31 ENCOUNTER — Other Ambulatory Visit: Payer: Medicaid Other

## 2020-12-31 ENCOUNTER — Other Ambulatory Visit: Payer: Self-pay

## 2020-12-31 ENCOUNTER — Ambulatory Visit (INDEPENDENT_AMBULATORY_CARE_PROVIDER_SITE_OTHER): Payer: Medicaid Other | Admitting: Obstetrics

## 2020-12-31 VITALS — BP 110/70 | Ht 61.5 in | Wt 199.6 lb

## 2020-12-31 DIAGNOSIS — Z130 Encounter for screening for diseases of the blood and blood-forming organs and certain disorders involving the immune mechanism: Secondary | ICD-10-CM

## 2020-12-31 DIAGNOSIS — Z131 Encounter for screening for diabetes mellitus: Secondary | ICD-10-CM

## 2020-12-31 DIAGNOSIS — Z3480 Encounter for supervision of other normal pregnancy, unspecified trimester: Secondary | ICD-10-CM

## 2020-12-31 DIAGNOSIS — Z113 Encounter for screening for infections with a predominantly sexual mode of transmission: Secondary | ICD-10-CM

## 2020-12-31 DIAGNOSIS — Z3482 Encounter for supervision of other normal pregnancy, second trimester: Secondary | ICD-10-CM

## 2020-12-31 DIAGNOSIS — Z369 Encounter for antenatal screening, unspecified: Secondary | ICD-10-CM

## 2020-12-31 DIAGNOSIS — Z3A27 27 weeks gestation of pregnancy: Secondary | ICD-10-CM

## 2020-12-31 LAB — POCT URINALYSIS DIPSTICK OB: POC,PROTEIN,UA: NEGATIVE

## 2020-12-31 NOTE — Progress Notes (Signed)
Routine Prenatal Care Visit  Subjective  Meredith Hubbard is a 25 y.o. G2P1001 at [redacted]w[redacted]d being seen today for ongoing prenatal care.  She is currently monitored for the following issues for this low-risk pregnancy and has Supervision of other normal pregnancy, antepartum and Marijuana use on their problem list.  ----------------------------------------------------------------------------------- Patient reports no complaints.   Contractions: Not present. Vag. Bleeding: None.   . Leaking Fluid denies.  ----------------------------------------------------------------------------------- The following portions of the patient's history were reviewed and updated as appropriate: allergies, current medications, past family history, past medical history, past social history, past surgical history and problem list. Problem list updated.  Objective  Blood pressure 110/70, height 5' 1.5" (1.562 m), weight 199 lb 9.6 oz (90.5 kg), last menstrual period 06/25/2020. Pregravid weight 185 lb (83.9 kg) Total Weight Gain 14 lb 9.6 oz (6.623 kg) Urinalysis: Urine Protein    Urine Glucose    Fetal Status:           General:  Alert, oriented and cooperative. Patient is in no acute distress.  Skin: Skin is warm and dry. No rash noted.   Cardiovascular: Normal heart rate noted  Respiratory: Normal respiratory effort, no problems with respiration noted  Abdomen: Soft, gravid, appropriate for gestational age. Pain/Pressure: Absent     Pelvic:  Cervical exam deferred        Extremities: Normal range of motion.     Mental Status: Normal mood and affect. Normal behavior. Normal judgment and thought content.   Assessment   25 y.o. G2P1001 at [redacted]w[redacted]d by  03/31/2021, by Ultrasound presenting for routine prenatal visit  Plan   pregnancy2  Problems (from 05/16/20 to present)    Problem Noted Resolved   Marijuana use 08/25/2020 by Mirna Mires, CNM No   Overview Signed 08/25/2020 10:45 AM by Mirna Mires, CNM     07/2020- tested poisitive for MJ in early pregnancy. Advised to cease using during the pregnancy.      Supervision of other normal pregnancy, antepartum 08/13/2020 by Mirna Mires, CNM No   Overview Addendum 12/31/2020  9:16 AM by Mirna Mires, CNM     Nursing Staff Provider  Office Location  [facility] Dating   6 wk [place]  Language  English Anatomy US   incomplete,f/u done- normal female  Flu Vaccine   Genetic Screen  NIPS: declined  TDaP vaccine    Hgb A1C or  GTT Early : n/a Third trimester : 12/31/2020  Covid  vax x2   LAB RESULTS   Rhogam   n/a Blood Type AB/Positive/-- (05/20 1624)   Feeding Plan  Breast Antibody Negative (05/20 1624)  Contraception  Rubella 1.63 (05/20 1624)  Circumcision  RPR Non Reactive (05/20 1624)   Pediatrician   HBsAg Negative (05/20 1624)   Support Person  Feliz Beam HIV Non Reactive (05/20 1624)  Prenatal Classes  Declined Varicella  Immune    GBS  (For PCN allergy, check sensitivities)   BTL Consent  N/A    VBAC Consent  N/A Pap  NILM    Hgb Electro   normal hbg    CF  Negative     SMA  Negative                  Preterm labor symptoms and general obstetric precautions including but not limited to vaginal bleeding, contractions, leaking of fluid and fetal movement were reviewed in detail with the patient. Please refer to After Visit Summary for other counseling recommendations.  28  week labs today. Return in about 2 weeks (around 01/14/2021) for return OB.  Mirna Mires, CNM  12/31/2020 9:29 AM

## 2021-01-01 LAB — 28 WEEK RH+PANEL
Basophils Absolute: 0 10*3/uL (ref 0.0–0.2)
Basos: 0 %
EOS (ABSOLUTE): 0.2 10*3/uL (ref 0.0–0.4)
Eos: 2 %
Gestational Diabetes Screen: 140 mg/dL — ABNORMAL HIGH (ref 65–139)
HIV Screen 4th Generation wRfx: NONREACTIVE
Hematocrit: 29 % — ABNORMAL LOW (ref 34.0–46.6)
Hemoglobin: 9.7 g/dL — ABNORMAL LOW (ref 11.1–15.9)
Immature Grans (Abs): 0.1 10*3/uL (ref 0.0–0.1)
Immature Granulocytes: 1 %
Lymphocytes Absolute: 2.3 10*3/uL (ref 0.7–3.1)
Lymphs: 19 %
MCH: 30.4 pg (ref 26.6–33.0)
MCHC: 33.4 g/dL (ref 31.5–35.7)
MCV: 91 fL (ref 79–97)
Monocytes Absolute: 0.9 10*3/uL (ref 0.1–0.9)
Monocytes: 7 %
Neutrophils Absolute: 8.5 10*3/uL — ABNORMAL HIGH (ref 1.4–7.0)
Neutrophils: 71 %
Platelets: 285 10*3/uL (ref 150–450)
RBC: 3.19 x10E6/uL — ABNORMAL LOW (ref 3.77–5.28)
RDW: 12.4 % (ref 11.7–15.4)
RPR Ser Ql: NONREACTIVE
WBC: 12 10*3/uL — ABNORMAL HIGH (ref 3.4–10.8)

## 2021-01-05 ENCOUNTER — Telehealth: Payer: Self-pay

## 2021-01-05 ENCOUNTER — Other Ambulatory Visit: Payer: Self-pay | Admitting: Advanced Practice Midwife

## 2021-01-05 DIAGNOSIS — R7309 Other abnormal glucose: Secondary | ICD-10-CM

## 2021-01-05 NOTE — Telephone Encounter (Signed)
Pt calling to see if we are going to rx iron since didn't pass iron test and we said she was anemic.  [phone].

## 2021-01-05 NOTE — Progress Notes (Signed)
3 hr gtt ordered and asked front desk to call her to schedule.

## 2021-01-11 ENCOUNTER — Encounter: Payer: Medicaid Other | Admitting: Advanced Practice Midwife

## 2021-01-12 ENCOUNTER — Other Ambulatory Visit: Payer: Medicaid Other

## 2021-01-12 ENCOUNTER — Encounter: Payer: Self-pay | Admitting: Advanced Practice Midwife

## 2021-01-12 ENCOUNTER — Ambulatory Visit (INDEPENDENT_AMBULATORY_CARE_PROVIDER_SITE_OTHER): Payer: Medicaid Other | Admitting: Advanced Practice Midwife

## 2021-01-12 ENCOUNTER — Other Ambulatory Visit: Payer: Self-pay

## 2021-01-12 VITALS — BP 118/72 | Wt 201.0 lb

## 2021-01-12 DIAGNOSIS — R7309 Other abnormal glucose: Secondary | ICD-10-CM

## 2021-01-12 DIAGNOSIS — Z23 Encounter for immunization: Secondary | ICD-10-CM

## 2021-01-12 DIAGNOSIS — Z3483 Encounter for supervision of other normal pregnancy, third trimester: Secondary | ICD-10-CM

## 2021-01-12 DIAGNOSIS — Z3A28 28 weeks gestation of pregnancy: Secondary | ICD-10-CM

## 2021-01-12 LAB — POCT URINALYSIS DIPSTICK OB
Glucose, UA: NEGATIVE
POC,PROTEIN,UA: NEGATIVE

## 2021-01-12 NOTE — Telephone Encounter (Signed)
Patient will pick up OTC Fe- discussed at office visit today.

## 2021-01-12 NOTE — Progress Notes (Signed)
ROB - 3 hr gtt, no concerns. RM 5

## 2021-01-12 NOTE — Progress Notes (Signed)
Routine Prenatal Care Visit  Subjective  Meredith Hubbard is a 25 y.o. G2P1001 at [redacted]w[redacted]d being seen today for ongoing prenatal care.  She is currently monitored for the following issues for this low-risk pregnancy and has Supervision of other normal pregnancy, antepartum and Marijuana use on their problem list.  ----------------------------------------------------------------------------------- Patient reports no complaints.   Contractions: Not present. Vag. Bleeding: None.  Movement: Present. Leaking Fluid denies.  ----------------------------------------------------------------------------------- The following portions of the patient's history were reviewed and updated as appropriate: allergies, current medications, past family history, past medical history, past social history, past surgical history and problem list. Problem list updated.  Objective  Blood pressure 118/72, weight 201 lb (91.2 kg), last menstrual period 06/25/2020. Pregravid weight 185 lb (83.9 kg) Total Weight Gain 16 lb (7.258 kg) Urinalysis: Urine Protein Negative  Urine Glucose Negative  Fetal Status: Fetal Heart Rate (bpm): 142 Fundal Height: 30 cm Movement: Present     General:  Alert, oriented and cooperative. Patient is in no acute distress.  Skin: Skin is warm and dry. No rash noted.   Cardiovascular: Normal heart rate noted  Respiratory: Normal respiratory effort, no problems with respiration noted  Abdomen: Soft, gravid, appropriate for gestational age. Pain/Pressure: Present     Pelvic:  Cervical exam deferred        Extremities: Normal range of motion.  Edema: None  Mental Status: Normal mood and affect. Normal behavior. Normal judgment and thought content.   Assessment   25 y.o. G2P1001 at [redacted]w[redacted]d by  03/31/2021, by Ultrasound presenting for routine prenatal visit  Plan   pregnancy2  Problems (from 05/16/20 to present)     Problem Noted Resolved   Marijuana use 08/25/2020 by Mirna Mires, CNM No    Overview Signed 08/25/2020 10:45 AM by Mirna Mires, CNM    07/2020- tested poisitive for MJ in early pregnancy. Advised to cease using during the pregnancy.      Supervision of other normal pregnancy, antepartum 08/13/2020 by Mirna Mires, CNM No   Overview Addendum 01/12/2021  3:37 PM by Tresea Mall, CNM     Nursing Staff Provider  Office Location  [facility] Dating   6 wk [place]  Language  English Anatomy US   incomplete,f/u done- normal female  Flu Vaccine   Genetic Screen  NIPS: declined  TDaP vaccine   01/12/21 Hgb A1C or  GTT Early : n/a Third trimester : 12/31/2020  Covid  vax x2   LAB RESULTS   Rhogam   n/a Blood Type AB/Positive/-- (05/20 1624)   Feeding Plan  Breast Antibody Negative (05/20 1624)  Contraception  Rubella 1.63 (05/20 1624)  Circumcision  RPR Non Reactive (05/20 1624)   Pediatrician   HBsAg Negative (05/20 1624)   Support Person  Feliz Beam HIV Non Reactive (05/20 1624)  Prenatal Classes  Declined Varicella  Immune    GBS  (For PCN allergy, check sensitivities)   BTL Consent  N/A    VBAC Consent  N/A Pap  NILM    Hgb Electro   normal hbg    CF  Negative     SMA  Negative                3 hour gtt this morning   Preterm labor symptoms and general obstetric precautions including but not limited to vaginal bleeding, contractions, leaking of fluid and fetal movement were reviewed in detail with the patient. Please refer to After Visit Summary for other counseling recommendations.   Return  for scheduled follow up.  Tresea Mall, CNM 01/12/2021 3:42 PM

## 2021-01-13 LAB — GESTATIONAL GLUCOSE TOLERANCE
Glucose, Fasting: 76 mg/dL (ref 65–94)
Glucose, GTT - 1 Hour: 155 mg/dL (ref 65–179)
Glucose, GTT - 2 Hour: 131 mg/dL (ref 65–154)
Glucose, GTT - 3 Hour: 79 mg/dL (ref 65–139)

## 2021-01-25 ENCOUNTER — Other Ambulatory Visit: Payer: Self-pay

## 2021-01-25 ENCOUNTER — Ambulatory Visit (INDEPENDENT_AMBULATORY_CARE_PROVIDER_SITE_OTHER): Payer: Medicaid Other | Admitting: Obstetrics

## 2021-01-25 VITALS — BP 112/64 | Wt 201.0 lb

## 2021-01-25 DIAGNOSIS — Z3480 Encounter for supervision of other normal pregnancy, unspecified trimester: Secondary | ICD-10-CM

## 2021-01-25 DIAGNOSIS — Z3A30 30 weeks gestation of pregnancy: Secondary | ICD-10-CM

## 2021-01-25 LAB — POCT URINALYSIS DIPSTICK OB
Glucose, UA: NEGATIVE
POC,PROTEIN,UA: NEGATIVE

## 2021-01-25 NOTE — Progress Notes (Signed)
Routine Prenatal Care Visit  Subjective  Meredith Hubbard is a 25 y.o. G2P1001 at [redacted]w[redacted]d being seen today for ongoing prenatal care.  She is currently monitored for the following issues for this low-risk pregnancy and has Supervision of other normal pregnancy, antepartum and Marijuana use on their problem list.  ----------------------------------------------------------------------------------- Patient reports no complaints.   Contractions: Not present. Vag. Bleeding: None.  Movement: Present. Leaking Fluid denies.  ----------------------------------------------------------------------------------- The following portions of the patient's history were reviewed and updated as appropriate: allergies, current medications, past family history, past medical history, past social history, past surgical history and problem list. Problem list updated.  Objective  Blood pressure 112/64, weight 201 lb (91.2 kg), last menstrual period 06/25/2020. Pregravid weight 185 lb (83.9 kg) Total Weight Gain 16 lb (7.258 kg) Urinalysis: Urine Protein Negative  Urine Glucose Negative  Fetal Status:     Movement: Present     General:  Alert, oriented and cooperative. Patient is in no acute distress.  Skin: Skin is warm and dry. No rash noted.   Cardiovascular: Normal heart rate noted  Respiratory: Normal respiratory effort, no problems with respiration noted  Abdomen: Soft, gravid, appropriate for gestational age. Pain/Pressure: Present     Pelvic:  Cervical exam deferred        Extremities: Normal range of motion.     Mental Status: Normal mood and affect. Normal behavior. Normal judgment and thought content.   Assessment   25 y.o. G2P1001 at [redacted]w[redacted]d by  03/31/2021, by Ultrasound presenting for routine prenatal visit  Plan   pregnancy2  Problems (from 05/16/20 to present)    Problem Noted Resolved   Marijuana use 08/25/2020 by Mirna Mires, CNM No   Overview Signed 08/25/2020 10:45 AM by Mirna Mires, CNM     07/2020- tested poisitive for MJ in early pregnancy. Advised to cease using during the pregnancy.      Supervision of other normal pregnancy, antepartum 08/13/2020 by Mirna Mires, CNM No   Overview Addendum 01/25/2021  8:26 AM by Mirna Mires, CNM     Nursing Staff Provider  Office Location  [facility] Dating   6 wk [place]  Language  English Anatomy US   incomplete,f/u done- normal female  Flu Vaccine   Genetic Screen  NIPS: declined  TDaP vaccine   01/12/21 Hgb A1C or  GTT Early : n/a Third trimester elevated 1hr, passed 3 hr GTT  Covid  vax x2   LAB RESULTS   Rhogam   n/a Blood Type AB/Positive/-- (05/20 1624)   Feeding Plan  Breast Antibody Negative (05/20 1624)  Contraception  Rubella 1.63 (05/20 1624)  Circumcision  RPR Non Reactive (05/20 1624)   Pediatrician   HBsAg Negative (05/20 1624)   Support Person  Feliz Beam HIV Non Reactive (05/20 1624)  Prenatal Classes  Declined Varicella  Immune    GBS  (For PCN allergy, check sensitivities)   BTL Consent  N/A    VBAC Consent  N/A Pap  NILM    Hgb Electro   normal hbg    CF  Negative     SMA  Negative                  Preterm labor symptoms and general obstetric precautions including but not limited to vaginal bleeding, contractions, leaking of fluid and fetal movement were reviewed in detail with the patient. Please refer to After Visit Summary for other counseling recommendations.  Additional breastfeeding education provided today.  Return in  about 2 weeks (around 02/08/2021) for return OB.  Mirna Mires, CNM  01/25/2021 8:29 AM

## 2021-02-10 ENCOUNTER — Encounter: Payer: Self-pay | Admitting: Obstetrics and Gynecology

## 2021-02-10 ENCOUNTER — Ambulatory Visit (INDEPENDENT_AMBULATORY_CARE_PROVIDER_SITE_OTHER): Payer: Medicaid Other | Admitting: Obstetrics and Gynecology

## 2021-02-10 ENCOUNTER — Other Ambulatory Visit: Payer: Self-pay

## 2021-02-10 VITALS — BP 114/70 | Ht 61.5 in | Wt 205.2 lb

## 2021-02-10 DIAGNOSIS — Z3A33 33 weeks gestation of pregnancy: Secondary | ICD-10-CM

## 2021-02-10 DIAGNOSIS — O99013 Anemia complicating pregnancy, third trimester: Secondary | ICD-10-CM

## 2021-02-10 DIAGNOSIS — Z23 Encounter for immunization: Secondary | ICD-10-CM | POA: Diagnosis not present

## 2021-02-10 DIAGNOSIS — Z3483 Encounter for supervision of other normal pregnancy, third trimester: Secondary | ICD-10-CM

## 2021-02-10 LAB — POCT URINALYSIS DIPSTICK OB
Glucose, UA: NEGATIVE
POC,PROTEIN,UA: NEGATIVE

## 2021-02-10 NOTE — Patient Instructions (Addendum)
Third Trimester of Pregnancy The third trimester of pregnancy is from week 28 through week 40. This is months 7 through 9. The third trimester is a time when the unborn baby (fetus) is growing rapidly. At the end of the ninth month, the fetus is about 20 inches long and weighs 6-10 pounds. Body changes during your third trimester During the third trimester, your body will continue to go through many changes. The changes vary and generally return to normal after your baby is born. Physical changes Your weight will continue to increase. You can expect to gain 25-35 pounds (11-16 kg) by the end of the pregnancy if you begin pregnancy at a normal weight. If you are underweight, you can expect to gain 28-40 lb (about 13-18 kg), and if you are overweight, you can expect to gain 15-25 lb (about 7-11 kg). You may begin to get stretch marks on your hips, abdomen, and breasts. Your breasts will continue to grow and may hurt. A yellow fluid (colostrum) may leak from your breasts. This is the first milk you are producing for your baby. You may have changes in your hair. These can include thickening of your hair, rapid growth, and changes in texture. Some people also have hair loss during or after pregnancy, or hair that feels dry or thin. Your belly button may stick out. You may notice more swelling in your hands, face, or ankles. Health changes You may have heartburn. You may have constipation. You may develop hemorrhoids. You may develop swollen, bulging veins (varicose veins) in your legs. You may have increased body aches in the pelvis, back, or thighs. This is due to weight gain and increased hormones that are relaxing your joints. You may have increased tingling or numbness in your hands, arms, and legs. The skin on your abdomen may also feel numb. You may feel short of breath because of your expanding uterus. Other changes You may urinate more often because the fetus is moving lower into your pelvis  and pressing on your bladder. You may have more problems sleeping. This may be caused by the size of your abdomen, an increased need to urinate, and an increase in your body's metabolism. You may notice the fetus "dropping," or moving lower in your abdomen (lightening). You may have increased vaginal discharge. You may notice that you have pain around your pelvic bone as your uterus distends. Follow these instructions at home: Medicines Follow your health care provider's instructions regarding medicine use. Specific medicines may be either safe or unsafe to take during pregnancy. Do not take any medicines unless approved by your health care provider. Take a prenatal vitamin that contains at least 600 micrograms (mcg) of folic acid. Eating and drinking Eat a healthy diet that includes fresh fruits and vegetables, whole grains, good sources of protein such as meat, eggs, or tofu, and low-fat dairy products. Avoid raw meat and unpasteurized juice, milk, and cheese. These carry germs that can harm you and your baby. Eat 4 or 5 small meals rather than 3 large meals a day. You may need to take these actions to prevent or treat constipation: Drink enough fluid to keep your urine pale yellow. Eat foods that are high in fiber, such as beans, whole grains, and fresh fruits and vegetables. Limit foods that are high in fat and processed sugars, such as fried or sweet foods. Activity Exercise only as directed by your health care provider. Most people can continue their usual exercise routine during pregnancy. Try to   exercise for 30 minutes at least 5 days a week. Stop exercising if you experience contractions in the uterus. Stop exercising if you develop pain or cramping in the lower abdomen or lower back. Avoid heavy lifting. Do not exercise if it is very hot or humid or if you are at a high altitude. If you choose to, you may continue to have sex unless your health care provider tells you not  to. Relieving pain and discomfort Take frequent breaks and rest with your legs raised (elevated) if you have leg cramps or low back pain. Take warm sitz baths to soothe any pain or discomfort caused by hemorrhoids. Use hemorrhoid cream if your health care provider approves. Wear a supportive bra to prevent discomfort from breast tenderness. If you develop varicose veins: Wear support hose as told by your health care provider. Elevate your feet for 15 minutes, 3-4 times a day. Limit salt in your diet. Safety Talk to your health care provider before traveling far distances. Do not use hot tubs, steam rooms, or saunas. Wear your seat belt at all times when driving or riding in a car. Talk with your health care provider if someone is verbally or physically abusive to you. Preparing for birth To prepare for the arrival of your baby: Take prenatal classes to understand, practice, and ask questions about labor and delivery. Visit the hospital and tour the maternity area. Purchase a rear-facing car seat and make sure you know how to install it in your car. Prepare the baby's room or sleeping area. Make sure to remove all pillows and stuffed animals from the baby's crib to prevent suffocation. General instructions Avoid cat litter boxes and soil used by cats. These carry germs that can cause birth defects in the baby. If you have a cat, ask someone to clean the litter box for you. Do not douche or use tampons. Do not use scented sanitary pads. Do not use any products that contain nicotine or tobacco, such as cigarettes, e-cigarettes, and chewing tobacco. If you need help quitting, ask your health care provider. Do not use any herbal remedies, illegal drugs, or medicines that were not prescribed to you. Chemicals in these products can harm your baby. Do not drink alcohol. You will have more frequent prenatal exams during the third trimester. During a routine prenatal visit, your health care provider  will do a physical exam, perform tests, and discuss your overall health. Keep all follow-up visits. This is important. Where to find more information American Pregnancy Association: americanpregnancy.org American College of Obstetricians and Gynecologists: acog.org/en/Womens%20Health/Pregnancy Office on Women's Health: womenshealth.gov/pregnancy Contact a health care provider if you have: A fever. Mild pelvic cramps, pelvic pressure, or nagging pain in your abdominal area or lower back. Vomiting or diarrhea. Bad-smelling vaginal discharge or foul-smelling urine. Pain when you urinate. A headache that does not go away when you take medicine. Visual changes or see spots in front of your eyes. Get help right away if: Your water breaks. You have regular contractions less than 5 minutes apart. You have spotting or bleeding from your vagina. You have severe abdominal pain. You have difficulty breathing. You have chest pain. You have fainting spells. You have not felt your baby move for the time period told by your health care provider. You have new or increased pain, swelling, or redness in an arm or leg. Summary The third trimester of pregnancy is from week 28 through week 40 (months 7 through 9). You may have more problems sleeping.   This can be caused by the size of your abdomen, an increased need to urinate, and an increase in your body's metabolism. You will have more frequent prenatal exams during the third trimester. Keep all follow-up visits. This is important. This information is not intended to replace advice given to you by your health care provider. Make sure you discuss any questions you have with your health care provider. Document Revised: 09/18/2019 Document Reviewed: 07/25/2019 Elsevier Patient Education  2022 [employer] Iron-Rich Diet Iron is a mineral that helps your body produce hemoglobin. Hemoglobin is a protein in red blood cells that carries oxygen to your body's  tissues. Eating too little iron may cause you to feel weak and tired, and it can increase your risk of infection. Iron is naturally found in many foods, and many foods have iron added to them (are iron-fortified). You may need to follow an iron-rich diet if you do not have enough iron in your body due to certain medical conditions. The amount of iron that you need each day depends on your age, your sex, and any medical conditions you have. Follow instructions from your health care provider or a dietitian about how much iron you should eat each day. What are tips for following this plan? Reading food labels Check food labels to see how many milligrams (mg) of iron are in each serving. Cooking Cook foods in pots and pans that are made from iron. Take these steps to make it easier for your body to absorb iron from certain foods: Soak beans overnight before cooking. Soak whole grains overnight and drain them before using. Ferment flours before baking, such as by using yeast in bread dough. Meal planning When you eat foods that contain iron, you should eat them with foods that are high in vitamin C. These include oranges, peppers, tomatoes, potatoes, and mangoes. Vitamin C helps your body absorb iron. Certain foods and drinks prevent your body from absorbing iron properly. Avoid eating these foods in the same meal as iron-rich foods or with iron supplements. These foods include: Coffee, black tea, and red wine. Milk, dairy products, and foods that are high in calcium. Beans and soybeans. Whole grains. General information Take iron supplements only as told by your health care provider. An overdose of iron can be life-threatening. If you were prescribed iron supplements, take them with orange juice or a vitamin C supplement. When you eat iron-fortified foods or take an iron supplement, you should also eat foods that naturally contain iron, such as meat, poultry, and fish. Eating naturally iron-rich  foods helps your body absorb the iron that is added to other foods or contained in a supplement. Iron from animal sources is better absorbed than iron from plant sources. What foods should I eat? Fruits Prunes. Raisins. Eat fruits high in vitamin C, such as oranges, grapefruits, and strawberries, with iron-rich foods. Vegetables Spinach (cooked). Green peas. Broccoli. Fermented vegetables. Eat vegetables high in vitamin C, such as leafy greens, potatoes, bell peppers, and tomatoes, with iron-rich foods. Grains Iron-fortified breakfast cereal. Iron-fortified whole-wheat bread. Enriched rice. Sprouted grains. Meats and other proteins Beef liver. Beef. Turkey. Chicken. Oysters. Shrimp. Tuna. Sardines. Chickpeas. Nuts. Tofu. Pumpkin seeds. Beverages Tomato juice. Fresh orange juice. Prune juice. Hibiscus tea. Iron-fortified instant breakfast shakes. Sweets and desserts Blackstrap molasses. Seasonings and condiments Tahini. Fermented soy sauce. Other foods Wheat germ. The items listed above may not be a complete list of recommended foods and beverages. Contact a dietitian for more information. What   foods should I limit? These are foods that should be limited while eating iron-rich foods as they can reduce the absorption of iron in your body. Grains Whole grains. Bran cereal. Bran flour. Meats and other proteins Soybeans. Products made from soy protein. Black beans. Lentils. Mung beans. Split peas. Dairy Milk. Cream. Cheese. Yogurt. Cottage cheese. Beverages Coffee. Black tea. Red wine. Sweets and desserts Cocoa. Chocolate. Ice cream. Seasonings and condiments Basil. Oregano. Large amounts of parsley. The items listed above may not be a complete list of foods and beverages you should limit. Contact a dietitian for more information. Summary Iron is a mineral that helps your body produce hemoglobin. Hemoglobin is a protein in red blood cells that carries oxygen to your body's  tissues. Iron is naturally found in many foods, and many foods have iron added to them (are iron-fortified). When you eat foods that contain iron, you should eat them with foods that are high in vitamin C. Vitamin C helps your body absorb iron. Certain foods and drinks prevent your body from absorbing iron properly, such as whole grains and dairy products. You should avoid eating these foods in the same meal as iron-rich foods or with iron supplements. This information is not intended to replace advice given to you by your health care provider. Make sure you discuss any questions you have with your health care provider. Document Revised: 03/23/2020 Document Reviewed: 03/23/2020 Elsevier Patient Education  2022 [employer].

## 2021-02-10 NOTE — Progress Notes (Signed)
Routine Prenatal Care Visit  Subjective  Meredith Hubbard is a 25 y.o. G2P1001 at [redacted]w[redacted]d being seen today for ongoing prenatal care.  She is currently monitored for the following issues for this low-risk pregnancy and has Supervision of other normal pregnancy, antepartum and Marijuana use on their problem list.  ----------------------------------------------------------------------------------- Patient reports she has been having every other day light headedness for about 30 minutes, sometimes shortness of breath, generally not bothersome.  Contractions: Not present. Vag. Bleeding: None.  Movement: Present. Denies leaking of fluid.  ----------------------------------------------------------------------------------- The following portions of the patient's history were reviewed and updated as appropriate: allergies, current medications, past family history, past medical history, past social history, past surgical history and problem list. Problem list updated.   Objective  Blood pressure 114/70, height 5' 1.5" (1.562 m), weight 205 lb 3.2 oz (93.1 kg), last menstrual period 06/25/2020. Pregravid weight 185 lb (83.9 kg) Total Weight Gain 20 lb 3.2 oz (9.163 kg) Urinalysis:      Fetal Status: Fetal Heart Rate (bpm): 140 Fundal Height: 35 cm Movement: Present     General:  Alert, oriented and cooperative. Patient is in no acute distress.  Skin: Skin is warm and dry. No rash noted.   Cardiovascular: Normal heart rate noted  Respiratory: Normal respiratory effort, no problems with respiration noted  Abdomen: Soft, gravid, appropriate for gestational age. Pain/Pressure: Absent     Pelvic:  Cervical exam deferred        Extremities: Normal range of motion.  Edema: None  Mental Status: Normal mood and affect. Normal behavior. Normal judgment and thought content.     Assessment   25 y.o. G2P1001 at [redacted]w[redacted]d by  03/31/2021, by Ultrasound presenting for routine prenatal visit  Plan   pregnancy2   Problems (from 05/16/20 to present)     Problem Noted Resolved   Marijuana use 08/25/2020 by Mirna Mires, CNM No   Overview Signed 08/25/2020 10:45 AM by Mirna Mires, CNM    07/2020- tested poisitive for MJ in early pregnancy. Advised to cease using during the pregnancy.      Supervision of other normal pregnancy, antepartum 08/13/2020 by Mirna Mires, CNM No   Overview Addendum 01/25/2021  8:26 AM by Mirna Mires, CNM     Nursing Staff Provider  Office Location  [facility] Dating   6 wk [place]  Language  English Anatomy US   incomplete,f/u done- normal female  Flu Vaccine   Genetic Screen  NIPS: declined  TDaP vaccine   01/12/21 Hgb A1C or  GTT Early : n/a Third trimester elevated 1hr, passed 3 hr GTT  Covid  vax x2   LAB RESULTS   Rhogam   n/a Blood Type AB/Positive/-- (05/20 1624)   Feeding Plan  Breast Antibody Negative (05/20 1624)  Contraception  Rubella 1.63 (05/20 1624)  Circumcision  RPR Non Reactive (05/20 1624)   Pediatrician   HBsAg Negative (05/20 1624)   Support Person  Feliz Beam HIV Non Reactive (05/20 1624)  Prenatal Classes  Declined Varicella  Immune    GBS  (For PCN allergy, check sensitivities)   BTL Consent  N/A    VBAC Consent  N/A Pap  NILM    Hgb Electro   normal hbg    CF  Negative     SMA  Negative                  Repeat BP 130/70 Discussed warning signs of preeclampsia Discussed initiation of oral  iron, increased dietary iron sources, labs today.  Referral to hematology  Gestational age appropriate obstetric precautions including but not limited to vaginal bleeding, contractions, leaking of fluid and fetal movement were reviewed in detail with the patient.    Return in about 2 weeks (around 02/24/2021) for ROB in person.  Natale Milch MD [facility], [facility] 02/10/2021, 8:38 AM

## 2021-02-11 LAB — IRON AND TIBC
Iron Saturation: 7 % — CL (ref 15–55)
Iron: 37 ug/dL (ref 27–159)
Total Iron Binding Capacity: 524 ug/dL — ABNORMAL HIGH (ref 250–450)
UIBC: 487 ug/dL — ABNORMAL HIGH (ref 131–425)

## 2021-02-11 LAB — FERRITIN: Ferritin: 9 ng/mL — ABNORMAL LOW (ref 15–150)

## 2021-02-11 LAB — FOLATE: Folate: 12.3 ng/mL (ref >3.0)

## 2021-02-11 LAB — VITAMIN B12: Vitamin B-12: 409 pg/mL (ref 232–1245)

## 2021-02-18 ENCOUNTER — Other Ambulatory Visit: Payer: Self-pay

## 2021-02-18 ENCOUNTER — Encounter: Payer: Self-pay | Admitting: Internal Medicine

## 2021-02-18 ENCOUNTER — Inpatient Hospital Stay: Payer: Medicaid Other

## 2021-02-18 ENCOUNTER — Inpatient Hospital Stay: Payer: Medicaid Other | Attending: Internal Medicine | Admitting: Internal Medicine

## 2021-02-18 DIAGNOSIS — E611 Iron deficiency: Secondary | ICD-10-CM

## 2021-02-18 DIAGNOSIS — O99013 Anemia complicating pregnancy, third trimester: Secondary | ICD-10-CM | POA: Insufficient documentation

## 2021-02-18 DIAGNOSIS — D509 Iron deficiency anemia, unspecified: Secondary | ICD-10-CM | POA: Insufficient documentation

## 2021-02-18 LAB — CBC WITH DIFFERENTIAL/PLATELET
Abs Immature Granulocytes: 0.22 10*3/uL — ABNORMAL HIGH (ref 0.00–0.07)
Basophils Absolute: 0.1 10*3/uL (ref 0.0–0.1)
Basophils Relative: 0 %
Eosinophils Absolute: 0.3 10*3/uL (ref 0.0–0.5)
Eosinophils Relative: 2 %
HCT: 29.9 % — ABNORMAL LOW (ref 36.0–46.0)
Hemoglobin: 9.6 g/dL — ABNORMAL LOW (ref 12.0–15.0)
Immature Granulocytes: 2 %
Lymphocytes Relative: 21 %
Lymphs Abs: 3.1 10*3/uL (ref 0.7–4.0)
MCH: 28.2 pg (ref 26.0–34.0)
MCHC: 32.1 g/dL (ref 30.0–36.0)
MCV: 87.9 fL (ref 80.0–100.0)
Monocytes Absolute: 1 10*3/uL (ref 0.1–1.0)
Monocytes Relative: 7 %
Neutro Abs: 10.3 10*3/uL — ABNORMAL HIGH (ref 1.7–7.7)
Neutrophils Relative %: 68 %
Platelets: 254 10*3/uL (ref 150–400)
RBC: 3.4 MIL/uL — ABNORMAL LOW (ref 3.87–5.11)
RDW: 14.4 % (ref 11.5–15.5)
WBC: 15 10*3/uL — ABNORMAL HIGH (ref 4.0–10.5)
nRBC: 0.1 % (ref 0.0–0.2)

## 2021-02-18 NOTE — Assessment & Plan Note (Addendum)
#   Anemia-September 2022 hemoglobin 9 iron deficiency- ferritin: 4.  Likely secondary to pregnancy/third trimester.    # Patient is symptomatic from worsening fatigue-recommend IV iron infusions. Discussed the potential acute infusion reactions with IV iron; which are quite rare.  Patient understands the risk; will proceed with infusions.  Thank you, Dr.Shuman for allowing me to participate in the care of your pleasant patient. Please do not hesitate to contact me with questions or concerns in the interim.  # DISPOSITION: # labs today # venofer weeklyx 4- start asap # follow up in 3 months- MD; labs/ cbc; possible venofer- Dr.B # follow

## 2021-02-18 NOTE — Progress Notes (Signed)
Pt states she has been feeling very tired and worn out as well as seeing spots yesterday gets dizzy at random times. Pt is due on 12/7.

## 2021-02-18 NOTE — Progress Notes (Signed)
[facility] CONSULT NOTE  Patient Care Team: Pcp, No as PCP - General  CHIEF COMPLAINTS/PURPOSE OF CONSULTATION: ANEMIA   HEMATOLOGY HISTORY:  # ANEMIA iron deficiency-pregnancy ; EDD dec 17th, 2022  Results for Meredith, Hubbard (MRN [id number]) as of 02/18/2021 11:18  Ref. Range 02/10/2021 08:48  Iron Latest Ref Range: 27 - 159 ug/dL 37  UIBC Latest Ref Range: 131 - 425 ug/dL 193 (H)  TIBC Latest Ref Range: 250 - 450 ug/dL 790 (H)  Ferritin Latest Ref Range: 15 - 150 ng/mL 9 (L)  Iron Saturation Latest Ref Range: 15 - 55 % 7 (LL)  Folate Latest Ref Range: >3.0 ng/mL 12.3    EDD-Dec 3rd, 2022 [third trimester]  HISTORY OF PRESENTING ILLNESS:  Meredith Hubbard 25 y.o.  female has been referred to [place] for further evaluation/work-up for anemia.  Patient is currently in third trimester pregnancy; due date April 10, 2021.  Complains of extreme fatigue.  She feels dizzy.  Admits to shortness of breath on exertion.  She is currently not on oral iron.   Blood in stools: None Change in bowel habits- None Blood in urine: None Difficulty swallowing: None Abnormal weight loss: None Iron supplementation:none  Prior Blood transfusions: none Bariatric surgery: None EGD/Colonoscopy:none  Vaginal bleeding: None; pregnant- third trimester- EDD- DEC 17th.    Review of Systems  Constitutional:  Positive for malaise/fatigue. Negative for chills, diaphoresis, fever and weight loss.  HENT:  Negative for nosebleeds and sore throat.   Eyes:  Negative for double vision.  Respiratory:  Positive for shortness of breath. Negative for cough, hemoptysis, sputum production and wheezing.   Cardiovascular:  Negative for chest pain, palpitations, orthopnea and leg swelling.  Gastrointestinal:  Negative for abdominal pain, blood in stool, constipation, diarrhea, heartburn, melena, nausea and vomiting.  Genitourinary:  Negative for dysuria, frequency and urgency.  Musculoskeletal:  Negative for  back pain and joint pain.  Skin: Negative.  Negative for itching and rash.  Neurological:  Positive for dizziness. Negative for tingling, focal weakness, weakness and headaches.  Endo/Heme/Allergies:  Does not bruise/bleed easily.  Psychiatric/Behavioral:  Negative for depression. The patient is not nervous/anxious and does not have insomnia.    MEDICAL HISTORY:  Past Medical History:  Diagnosis Date   Herpes    HSV-2 infection     SURGICAL HISTORY: History reviewed. No pertinent surgical history.  SOCIAL HISTORY: Social History   Socioeconomic History   Marital status: Single    Spouse name: Not on file   Number of children: Not on file   Years of education: Not on file   Highest education level: Not on file  Occupational History   Not on file  Tobacco Use   Smoking status: Never   Smokeless tobacco: Never  Substance and Sexual Activity   Alcohol use: Never   Drug use: Yes    Types: Marijuana   Sexual activity: Yes  Other Topics Concern   Not on file  Social History Narrative   Not on file   Social Determinants of Health   Financial Resource Strain: Not on file  Food Insecurity: Not on file  Transportation Needs: Not on file  Physical Activity: Not on file  Stress: Not on file  Social Connections: Not on file  Intimate Partner Violence: Not on file    FAMILY HISTORY: Family History  Problem Relation Age of Onset   Hypertension Mother    Diabetes Maternal Uncle    Diabetes Other     ALLERGIES:  is allergic to codeine.  MEDICATIONS:  Current Outpatient Medications  Medication Sig Dispense Refill   Prenatal MV & Min w/FA-DHA (PRENATAL ADULT GUMMY/DHA/FA PO) Take by mouth.     ValACYclovir HCl (VALTREX PO) Take by mouth.     No current facility-administered medications for this visit.      PHYSICAL EXAMINATION:   Vitals:   02/18/21 1106  BP: 123/71  Pulse: (!) 103  Resp: 20  Temp: 97.9 F (36.6 C)  SpO2: 94%   Filed Weights   02/18/21  1106  Weight: 207 lb 3.2 oz (94 kg)   Gravid uterus.  Physical Exam Vitals and nursing note reviewed.  HENT:     Head: Normocephalic and atraumatic.     Mouth/Throat:     Pharynx: Oropharynx is clear.  Eyes:     Extraocular Movements: Extraocular movements intact.     Pupils: Pupils are equal, round, and reactive to light.  Cardiovascular:     Rate and Rhythm: Normal rate and regular rhythm.  Pulmonary:     Comments: Decreased breath sounds bilaterally.  Abdominal:     Palpations: Abdomen is soft.  Musculoskeletal:        General: Normal range of motion.     Cervical back: Normal range of motion.  Skin:    General: Skin is warm.  Neurological:     General: No focal deficit present.     Mental Status: She is alert and oriented to person, place, and time.  Psychiatric:        Behavior: Behavior normal.        Judgment: Judgment normal.    LABORATORY DATA:  I have reviewed the data as listed Lab Results  Component Value Date   WBC 12.0 (H) 12/31/2020   HGB 9.7 (L) 12/31/2020   HCT 29.0 (L) 12/31/2020   MCV 91 12/31/2020   PLT 285 12/31/2020   Recent Labs    08/10/20 1642  NA 133*  K 3.8  CL 102  CO2 22  GLUCOSE 70  BUN 10  CREATININE 0.65  CALCIUM 9.0  GFRNONAA >60     No results found.  Iron deficiency # Anemia-September 2022 hemoglobin 9 iron deficiency- ferritin: 4.  Likely secondary to pregnancy/third trimester.    # Patient is symptomatic from worsening fatigue-recommend IV iron infusions. Discussed the potential acute infusion reactions with IV iron; which are quite rare.  Patient understands the risk; will proceed with infusions.  Thank you, Dr.Shuman for allowing me to participate in the care of your pleasant patient. Please do not hesitate to contact me with questions or concerns in the interim.  # DISPOSITION: # labs today # venofer weeklyx 4- start asap # follow up in 3 months- MD; labs/ cbc; possible venofer- Dr.B # follow      All  questions were answered. The patient knows to call the clinic with any problems, questions or concerns.      Earna Coder, MD 02/18/2021 11:57 AM

## 2021-02-24 ENCOUNTER — Inpatient Hospital Stay: Payer: Medicaid Other | Attending: Internal Medicine

## 2021-02-24 ENCOUNTER — Other Ambulatory Visit: Payer: Self-pay

## 2021-02-24 VITALS — BP 105/70 | HR 92 | Resp 18

## 2021-02-24 DIAGNOSIS — R5383 Other fatigue: Secondary | ICD-10-CM | POA: Diagnosis not present

## 2021-02-24 DIAGNOSIS — E611 Iron deficiency: Secondary | ICD-10-CM | POA: Diagnosis not present

## 2021-02-24 DIAGNOSIS — Z79899 Other long term (current) drug therapy: Secondary | ICD-10-CM | POA: Insufficient documentation

## 2021-02-24 DIAGNOSIS — Z833 Family history of diabetes mellitus: Secondary | ICD-10-CM | POA: Insufficient documentation

## 2021-02-24 DIAGNOSIS — O99013 Anemia complicating pregnancy, third trimester: Secondary | ICD-10-CM | POA: Diagnosis not present

## 2021-02-24 DIAGNOSIS — R42 Dizziness and giddiness: Secondary | ICD-10-CM | POA: Insufficient documentation

## 2021-02-24 DIAGNOSIS — Z3A00 Weeks of gestation of pregnancy not specified: Secondary | ICD-10-CM | POA: Insufficient documentation

## 2021-02-24 DIAGNOSIS — R0602 Shortness of breath: Secondary | ICD-10-CM | POA: Diagnosis not present

## 2021-02-24 DIAGNOSIS — Z8249 Family history of ischemic heart disease and other diseases of the circulatory system: Secondary | ICD-10-CM | POA: Diagnosis not present

## 2021-02-24 MED ORDER — SODIUM CHLORIDE 0.9 % IV SOLN: 200.0000 mg | Freq: Once | INTRAVENOUS | Status: DC

## 2021-02-24 MED ORDER — SODIUM CHLORIDE 0.9 % IV SOLN
Freq: Once | INTRAVENOUS | Status: AC
  Filled 2021-02-24: qty 250

## 2021-02-24 MED ORDER — IRON SUCROSE 20 MG/ML IV SOLN
200.0000 mg | Freq: Once | INTRAVENOUS | Status: AC
  Administered 2021-02-24: 200 mg via INTRAVENOUS
  Filled 2021-02-24: qty 10

## 2021-02-24 NOTE — Patient Instructions (Signed)
CANCER CENTER [facility] REGIONAL MEDICAL ONCOLOGY  Discharge Instructions: Thank you for choosing [facility] to provide your oncology and hematology care.  If you have a lab appointment with the Cancer Center, please go directly to the Cancer Center and check in at the registration area.  Wear comfortable clothing and clothing appropriate for easy access to any Portacath or PICC line.   We strive to give you quality time with your provider. You may need to reschedule your appointment if you arrive late (15 or more minutes).  Arriving late affects you and other patients whose appointments are after yours.  Also, if you miss three or more appointments without notifying the office, you may be dismissed from the clinic at the provider's discretion.      For prescription refill requests, have your pharmacy contact our office and allow 72 hours for refills to be completed.    Today you received the following : Venofer   Iron Sucrose Injection What is this medication? IRON SUCROSE (EYE ern SOO krose) treats low levels of iron (iron deficiency anemia) in people with kidney disease. Iron is a mineral that plays an important role in making red blood cells, which carry oxygen from your lungs to the rest of your body. This medicine may be used for other purposes; ask your health care provider or pharmacist if you have questions. COMMON BRAND NAME(S): Venofer What should I tell my care team before I take this medication? They need to know if you have any of these conditions: Anemia not caused by low iron levels Heart disease High levels of iron in the blood Kidney disease Liver disease An unusual or allergic reaction to iron, other medications, foods, dyes, or preservatives Pregnant or trying to get pregnant Breast-feeding How should I use this medication? This medication is for infusion into a vein. It is given in a hospital or clinic setting. Talk to your care team about the use of  this medication in children. While this medication may be prescribed for children as young as 2 years for selected conditions, precautions do apply. Overdosage: If you think you have taken too much of this medicine contact a poison control center or emergency room at once. NOTE: This medicine is only for you. Do not share this medicine with others. What if I miss a dose? It is important not to miss your dose. Call your care team if you are unable to keep an appointment. What may interact with this medication? Do not take this medication with any of the following: Deferoxamine Dimercaprol Other iron products This medication may also interact with the following: Chloramphenicol Deferasirox This list may not describe all possible interactions. Give your health care provider a list of all the medicines, herbs, non-prescription drugs, or dietary supplements you use. Also tell them if you smoke, drink alcohol, or use illegal drugs. Some items may interact with your medicine. What should I watch for while using this medication? Visit your care team regularly. Tell your care team if your symptoms do not start to get better or if they get worse. You may need blood work done while you are taking this medication. You may need to follow a special diet. Talk to your care team. Foods that contain iron include: whole grains/cereals, dried fruits, beans, or peas, leafy green vegetables, and organ meats (liver, kidney). What side effects may I notice from receiving this medication? Side effects that you should report to your care team as soon as possible: Allergic  reactions-skin rash, itching, hives, swelling of the face, lips, tongue, or throat Low blood pressure-dizziness, feeling faint or lightheaded, blurry vision Shortness of breath Side effects that usually do not require medical attention (report to your care team if they continue or are bothersome): Flushing Headache Joint pain Muscle  pain Nausea Pain, redness, or irritation at injection site This list may not describe all possible side effects. Call your doctor for medical advice about side effects. You may report side effects to FDA at 1-800-FDA-1088. Where should I keep my medication? This medication is given in a hospital or clinic and will not be stored at home. NOTE: This sheet is a summary. It may not cover all possible information. If you have questions about this medicine, talk to your doctor, pharmacist, or health care provider.  2022 Elsevier/Gold Standard (2020-07-07 12:52:06)    To help prevent nausea and vomiting after your treatment, we encourage you to take your nausea medication as directed.  BELOW ARE SYMPTOMS THAT SHOULD BE REPORTED IMMEDIATELY: *FEVER GREATER THAN 100.4 F (38 C) OR HIGHER *CHILLS OR SWEATING *NAUSEA AND VOMITING THAT IS NOT CONTROLLED WITH YOUR NAUSEA MEDICATION *UNUSUAL SHORTNESS OF BREATH *UNUSUAL BRUISING OR BLEEDING *URINARY PROBLEMS (pain or burning when urinating, or frequent urination) *BOWEL PROBLEMS (unusual diarrhea, constipation, pain near the anus) TENDERNESS IN MOUTH AND THROAT WITH OR WITHOUT PRESENCE OF ULCERS (sore throat, sores in mouth, or a toothache) UNUSUAL RASH, SWELLING OR PAIN  UNUSUAL VAGINAL DISCHARGE OR ITCHING   Items with * indicate a potential emergency and should be followed up as soon as possible or go to the Emergency Department if any problems should occur.  Please show the CHEMOTHERAPY ALERT CARD or IMMUNOTHERAPY ALERT CARD at check-in to the Emergency Department and triage nurse.  Should you have questions after your visit or need to cancel or reschedule your appointment, please contact CANCER CENTER [facility] REGIONAL MEDICAL ONCOLOGY  [phone] and follow the prompts.  Office hours are 8:00 a.m. to 4:30 p.m. Monday - Friday. Please note that voicemails left after 4:00 p.m. may not be returned until the following business day.  We are closed  weekends and major holidays. You have access to a nurse at all times for urgent questions. Please call the main number to the clinic [phone] and follow the prompts.  For any non-urgent questions, you may also contact your provider using MyChart. We now offer e-Visits for anyone 65 and older to request care online for non-urgent symptoms. For details visit [email or web address].   Also download the MyChart app! Go to the app store, search "MyChart", open the app, select [place], and log in with your MyChart username and password.  Due to Covid, a mask is required upon entering the hospital/clinic. If you do not have a mask, one will be given to you upon arrival. For doctor visits, patients may have 1 support person aged 39 or older with them. For treatment visits, patients cannot have anyone with them due to current Covid guidelines and our immunocompromised population.

## 2021-02-26 ENCOUNTER — Ambulatory Visit (INDEPENDENT_AMBULATORY_CARE_PROVIDER_SITE_OTHER): Payer: Medicaid Other | Admitting: Advanced Practice Midwife

## 2021-02-26 ENCOUNTER — Other Ambulatory Visit: Payer: Self-pay

## 2021-02-26 ENCOUNTER — Encounter: Payer: Self-pay | Admitting: Advanced Practice Midwife

## 2021-02-26 VITALS — BP 120/80 | Wt 206.0 lb

## 2021-02-26 DIAGNOSIS — Z3483 Encounter for supervision of other normal pregnancy, third trimester: Secondary | ICD-10-CM

## 2021-02-26 DIAGNOSIS — Z3A35 35 weeks gestation of pregnancy: Secondary | ICD-10-CM

## 2021-02-26 LAB — POCT URINALYSIS DIPSTICK OB
Glucose, UA: NEGATIVE
POC,PROTEIN,UA: NEGATIVE

## 2021-02-26 NOTE — Progress Notes (Signed)
Routine Prenatal Care Visit  Subjective  Meredith Hubbard is a 25 y.o. G2P1001 at [redacted]w[redacted]d being seen today for ongoing prenatal care.  She is currently monitored for the following issues for this low-risk pregnancy and has Supervision of other normal pregnancy, antepartum; Marijuana use; and Iron deficiency on their problem list.  ----------------------------------------------------------------------------------- Patient reports occasional painful cramping and pelvic pressure for the past 2 weeks. She admits adequate hydration.   Contractions: Irregular. Vag. Bleeding: None.  Movement: Present. Leaking Fluid denies.  ----------------------------------------------------------------------------------- The following portions of the patient's history were reviewed and updated as appropriate: allergies, current medications, past family history, past medical history, past social history, past surgical history and problem list. Problem list updated.  Objective  Blood pressure 120/80, weight 206 lb (93.4 kg), last menstrual period 06/25/2020. Pregravid weight 185 lb (83.9 kg) Total Weight Gain 21 lb (9.526 kg) Urinalysis: Urine Protein    Urine Glucose    Fetal Status: Fetal Heart Rate (bpm): 137   Movement: Present     General:  Alert, oriented and cooperative. Patient is in no acute distress.  Skin: Skin is warm and dry. No rash noted.   Cardiovascular: Normal heart rate noted  Respiratory: Normal respiratory effort, no problems with respiration noted  Abdomen: Soft, gravid, appropriate for gestational age. Pain/Pressure: Present     Pelvic:  Cervical exam deferred        Extremities: Normal range of motion.  Edema: None  Mental Status: Normal mood and affect. Normal behavior. Normal judgment and thought content.   Assessment   25 y.o. G2P1001 at [redacted]w[redacted]d by  03/31/2021, by Ultrasound presenting for routine prenatal visit  Plan   pregnancy2  Problems (from 05/16/20 to present)    Problem Noted  Resolved   Marijuana use 08/25/2020 by Mirna Mires, CNM No   Overview Signed 08/25/2020 10:45 AM by Mirna Mires, CNM    07/2020- tested poisitive for MJ in early pregnancy. Advised to cease using during the pregnancy.      Supervision of other normal pregnancy, antepartum 08/13/2020 by Mirna Mires, CNM No   Overview Addendum 01/25/2021  8:26 AM by Mirna Mires, CNM     Nursing Staff Provider  Office Location  [facility] Dating   6 wk [place]  Language  English Anatomy US   incomplete,f/u done- normal female  Flu Vaccine   Genetic Screen  NIPS: declined  TDaP vaccine   01/12/21 Hgb A1C or  GTT Early : n/a Third trimester elevated 1hr, passed 3 hr GTT  Covid  vax x2   LAB RESULTS   Rhogam   n/a Blood Type AB/Positive/-- (05/20 1624)   Feeding Plan  Breast Antibody Negative (05/20 1624)  Contraception  Rubella 1.63 (05/20 1624)  Circumcision  RPR Non Reactive (05/20 1624)   Pediatrician   HBsAg Negative (05/20 1624)   Support Person  Feliz Beam HIV Non Reactive (05/20 1624)  Prenatal Classes  Declined Varicella  Immune    GBS  (For PCN allergy, check sensitivities)   BTL Consent  N/A    VBAC Consent  N/A Pap  NILM    Hgb Electro   normal hbg    CF  Negative     SMA  Negative                  Preterm labor symptoms and general obstetric precautions including but not limited to vaginal bleeding, contractions, leaking of fluid and fetal movement were reviewed in detail with the patient.  Return in about 1 week (around 03/05/2021) for rob.  Tresea Mall, CNM 02/26/2021 8:35 AM

## 2021-03-03 ENCOUNTER — Inpatient Hospital Stay: Payer: Medicaid Other

## 2021-03-03 ENCOUNTER — Other Ambulatory Visit: Payer: Self-pay

## 2021-03-03 ENCOUNTER — Ambulatory Visit (INDEPENDENT_AMBULATORY_CARE_PROVIDER_SITE_OTHER): Payer: Medicaid Other | Admitting: Obstetrics

## 2021-03-03 VITALS — BP 133/73 | HR 94 | Temp 96.3°F | Resp 18

## 2021-03-03 VITALS — BP 130/70 | Wt 214.0 lb

## 2021-03-03 DIAGNOSIS — Z3480 Encounter for supervision of other normal pregnancy, unspecified trimester: Secondary | ICD-10-CM

## 2021-03-03 DIAGNOSIS — Z3A36 36 weeks gestation of pregnancy: Secondary | ICD-10-CM

## 2021-03-03 DIAGNOSIS — O99013 Anemia complicating pregnancy, third trimester: Secondary | ICD-10-CM | POA: Diagnosis not present

## 2021-03-03 DIAGNOSIS — E611 Iron deficiency: Secondary | ICD-10-CM

## 2021-03-03 LAB — POCT URINALYSIS DIPSTICK OB
Glucose, UA: NEGATIVE
POC,PROTEIN,UA: NEGATIVE

## 2021-03-03 MED ORDER — SODIUM CHLORIDE 0.9 % IV SOLN
Freq: Once | INTRAVENOUS | Status: AC
Start: 2021-03-03 — End: 2021-03-03
  Filled 2021-03-03: qty 250

## 2021-03-03 MED ORDER — SODIUM CHLORIDE 0.9 % IV SOLN: 200.0000 mg | Freq: Once | INTRAVENOUS | Status: DC

## 2021-03-03 MED ORDER — IRON SUCROSE 20 MG/ML IV SOLN
200.0000 mg | Freq: Once | INTRAVENOUS | Status: AC
  Administered 2021-03-03: 200 mg via INTRAVENOUS
  Filled 2021-03-03: qty 10

## 2021-03-03 NOTE — Addendum Note (Signed)
Addended by: Donnetta Hail on: 03/03/2021 04:20 PM   Modules accepted: Orders

## 2021-03-03 NOTE — Progress Notes (Signed)
ROB- no concerns 

## 2021-03-03 NOTE — Patient Instructions (Signed)
CANCER CENTER [place] REGIONAL MEDICAL ONCOLOGY  Discharge Instructions: Thank you for choosing [facility] to provide your oncology and hematology care.  If you have a lab appointment with the Cancer Center, please go directly to the Cancer Center and check in at the registration area.  Wear comfortable clothing and clothing appropriate for easy access to any Portacath or PICC line.   We strive to give you quality time with your provider. You may need to reschedule your appointment if you arrive late (15 or more minutes).  Arriving late affects you and other patients whose appointments are after yours.  Also, if you miss three or more appointments without notifying the office, you may be dismissed from the clinic at the provider's discretion.      For prescription refill requests, have your pharmacy contact our office and allow 72 hours for refills to be completed.    Today you received the following chemotherapy and/or immunotherapy agents VENOFER      To help prevent nausea and vomiting after your treatment, we encourage you to take your nausea medication as directed.  BELOW ARE SYMPTOMS THAT SHOULD BE REPORTED IMMEDIATELY: *FEVER GREATER THAN 100.4 F (38 C) OR HIGHER *CHILLS OR SWEATING *NAUSEA AND VOMITING THAT IS NOT CONTROLLED WITH YOUR NAUSEA MEDICATION *UNUSUAL SHORTNESS OF BREATH *UNUSUAL BRUISING OR BLEEDING *URINARY PROBLEMS (pain or burning when urinating, or frequent urination) *BOWEL PROBLEMS (unusual diarrhea, constipation, pain near the anus) TENDERNESS IN MOUTH AND THROAT WITH OR WITHOUT PRESENCE OF ULCERS (sore throat, sores in mouth, or a toothache) UNUSUAL RASH, SWELLING OR PAIN  UNUSUAL VAGINAL DISCHARGE OR ITCHING   Items with * indicate a potential emergency and should be followed up as soon as possible or go to the Emergency Department if any problems should occur.  Please show the CHEMOTHERAPY ALERT CARD or IMMUNOTHERAPY ALERT CARD at check-in to  the Emergency Department and triage nurse.  Should you have questions after your visit or need to cancel or reschedule your appointment, please contact CANCER CENTER [place] REGIONAL MEDICAL ONCOLOGY  [phone] and follow the prompts.  Office hours are 8:00 a.m. to 4:30 p.m. Monday - Friday. Please note that voicemails left after 4:00 p.m. may not be returned until the following business day.  We are closed weekends and major holidays. You have access to a nurse at all times for urgent questions. Please call the main number to the clinic [phone] and follow the prompts.  For any non-urgent questions, you may also contact your provider using MyChart. We now offer e-Visits for anyone 18 and older to request care online for non-urgent symptoms. For details visit [email or web address].   Also download the MyChart app! Go to the app store, search "MyChart", open the app, select [place], and log in with your MyChart username and password.  Due to Covid, a mask is required upon entering the hospital/clinic. If you do not have a mask, one will be given to you upon arrival. For doctor visits, patients may have 1 support person aged 18 or older with them. For treatment visits, patients cannot have anyone with them due to current Covid guidelines and our immunocompromised population.   Iron Sucrose Injection What is this medication? IRON SUCROSE (EYE ern SOO krose) treats low levels of iron (iron deficiency anemia) in people with kidney disease. Iron is a mineral that plays an important role in making red blood cells, which carry oxygen from your lungs to the rest of your body. This medicine may   be used for other purposes; ask your health care provider or pharmacist if you have questions. COMMON BRAND NAME(S): Venofer What should I tell my care team before I take this medication? They need to know if you have any of these conditions: Anemia not caused by low iron levels Heart disease High levels  of iron in the blood Kidney disease Liver disease An unusual or allergic reaction to iron, other medications, foods, dyes, or preservatives Pregnant or trying to get pregnant Breast-feeding How should I use this medication? This medication is for infusion into a vein. It is given in a hospital or clinic setting. Talk to your care team about the use of this medication in children. While this medication may be prescribed for children as young as 2 years for selected conditions, precautions do apply. Overdosage: If you think you have taken too much of this medicine contact a poison control center or emergency room at once. NOTE: This medicine is only for you. Do not share this medicine with others. What if I miss a dose? It is important not to miss your dose. Call your care team if you are unable to keep an appointment. What may interact with this medication? Do not take this medication with any of the following: Deferoxamine Dimercaprol Other iron products This medication may also interact with the following: Chloramphenicol Deferasirox This list may not describe all possible interactions. Give your health care provider a list of all the medicines, herbs, non-prescription drugs, or dietary supplements you use. Also tell them if you smoke, drink alcohol, or use illegal drugs. Some items may interact with your medicine. What should I watch for while using this medication? Visit your care team regularly. Tell your care team if your symptoms do not start to get better or if they get worse. You may need blood work done while you are taking this medication. You may need to follow a special diet. Talk to your care team. Foods that contain iron include: whole grains/cereals, dried fruits, beans, or peas, leafy green vegetables, and organ meats (liver, kidney). What side effects may I notice from receiving this medication? Side effects that you should report to your care team as soon as  possible: Allergic reactions--skin rash, itching, hives, swelling of the face, lips, tongue, or throat Low blood pressure--dizziness, feeling faint or lightheaded, blurry vision Shortness of breath Side effects that usually do not require medical attention (report to your care team if they continue or are bothersome): Flushing Headache Joint pain Muscle pain Nausea Pain, redness, or irritation at injection site This list may not describe all possible side effects. Call your doctor for medical advice about side effects. You may report side effects to FDA at 1-800-FDA-1088. Where should I keep my medication? This medication is given in a hospital or clinic and will not be stored at home. NOTE: This sheet is a summary. It may not cover all possible information. If you have questions about this medicine, talk to your doctor, pharmacist, or health care provider.  2022 Elsevier/Gold Standard (2020-09-04 00:00:00)  

## 2021-03-03 NOTE — Progress Notes (Signed)
Routine Prenatal Care Visit  Subjective  Meredith Hubbard is a 25 y.o. G2P1001 at [redacted]w[redacted]d being seen today for ongoing prenatal care.  She is currently monitored for the following issues for this high-risk pregnancy and has Supervision of other normal pregnancy, antepartum; Marijuana use; and Iron deficiency on their problem list.  ----------------------------------------------------------------------------------- Patient reports no complaints.  She is having GBS testing today.  .  .   Pincus Large Fluid denies.  ----------------------------------------------------------------------------------- The following portions of the patient's history were reviewed and updated as appropriate: allergies, current medications, past family history, past medical history, past social history, past surgical history and problem list. Problem list updated.  Objective  Blood pressure 130/70, weight 214 lb (97.1 kg), last menstrual period 06/25/2020. Pregravid weight 185 lb (83.9 kg) Total Weight Gain 29 lb (13.2 kg) Urinalysis: Urine Protein    Urine Glucose    Fetal Status:           General:  Alert, oriented and cooperative. Patient is in no acute distress.  Skin: Skin is warm and dry. No rash noted.   Cardiovascular: Normal heart rate noted  Respiratory: Normal respiratory effort, no problems with respiration noted  Abdomen: Soft, gravid, appropriate for gestational age.       Pelvic:  Cervical exam performed      1 cm/50%/-3, midway and softening  Extremities: Normal range of motion.     Mental Status: Normal mood and affect. Normal behavior. Normal judgment and thought content.   Assessment   25 y.o. G2P1001 at [redacted]w[redacted]d by  03/31/2021, by Ultrasound presenting for routine prenatal visit  Plan   pregnancy2  Problems (from 05/16/20 to present)    Problem Noted Resolved   Marijuana use 08/25/2020 by Mirna Mires, CNM No   Overview Signed 08/25/2020 10:45 AM by Mirna Mires, CNM    07/2020- tested  poisitive for MJ in early pregnancy. Advised to cease using during the pregnancy.      Supervision of other normal pregnancy, antepartum 08/13/2020 by Mirna Mires, CNM No   Overview Addendum 01/25/2021  8:26 AM by Mirna Mires, CNM     Nursing Staff Provider  Office Location  [facility] Dating   6 wk [place]  Language  English Anatomy US   incomplete,f/u done- normal female  Flu Vaccine   Genetic Screen  NIPS: declined  TDaP vaccine   01/12/21 Hgb A1C or  GTT Early : n/a Third trimester elevated 1hr, passed 3 hr GTT  Covid  vax x2   LAB RESULTS   Rhogam   n/a Blood Type AB/Positive/-- (05/20 1624)   Feeding Plan  Breast Antibody Negative (05/20 1624)  Contraception  Rubella 1.63 (05/20 1624)  Circumcision  RPR Non Reactive (05/20 1624)   Pediatrician   HBsAg Negative (05/20 1624)   Support Person  Feliz Beam HIV Non Reactive (05/20 1624)  Prenatal Classes  Declined Varicella  Immune    GBS  (For PCN allergy, check sensitivities)   BTL Consent  N/A    VBAC Consent  N/A Pap  NILM    Hgb Electro   normal hbg    CF  Negative     SMA  Negative                  Term labor symptoms and general obstetric precautions including but not limited to vaginal bleeding, contractions, leaking of fluid and fetal movement were reviewed in detail with the patient. GBS culture swab sent. Please refer to After  Visit Summary for other counseling recommendations.   No follow-ups on file.  Mirna Mires, CNM  03/03/2021 4:08 PM

## 2021-03-06 LAB — CULTURE, BETA STREP (GROUP B ONLY): Strep Gp B Culture: POSITIVE — AB

## 2021-03-08 ENCOUNTER — Other Ambulatory Visit: Payer: Self-pay

## 2021-03-08 ENCOUNTER — Ambulatory Visit
Admission: RE | Admit: 2021-03-08 | Discharge: 2021-03-08 | Disposition: A | Discharge status: Home / Self Care | Payer: Medicaid Other | Source: Ambulatory Visit | Attending: Obstetrics | Admitting: Obstetrics

## 2021-03-08 DIAGNOSIS — Z3483 Encounter for supervision of other normal pregnancy, third trimester: Secondary | ICD-10-CM | POA: Insufficient documentation

## 2021-03-08 DIAGNOSIS — Z3480 Encounter for supervision of other normal pregnancy, unspecified trimester: Secondary | ICD-10-CM | POA: Diagnosis present

## 2021-03-10 ENCOUNTER — Encounter: Payer: Self-pay | Admitting: Obstetrics and Gynecology

## 2021-03-10 ENCOUNTER — Ambulatory Visit (INDEPENDENT_AMBULATORY_CARE_PROVIDER_SITE_OTHER): Payer: Medicaid Other | Admitting: Obstetrics and Gynecology

## 2021-03-10 ENCOUNTER — Inpatient Hospital Stay: Payer: Medicaid Other

## 2021-03-10 ENCOUNTER — Other Ambulatory Visit: Payer: Self-pay

## 2021-03-10 VITALS — BP 118/72 | HR 94 | Temp 97.0°F | Resp 18

## 2021-03-10 DIAGNOSIS — Z3480 Encounter for supervision of other normal pregnancy, unspecified trimester: Secondary | ICD-10-CM

## 2021-03-10 DIAGNOSIS — O99013 Anemia complicating pregnancy, third trimester: Secondary | ICD-10-CM | POA: Diagnosis not present

## 2021-03-10 DIAGNOSIS — E611 Iron deficiency: Secondary | ICD-10-CM

## 2021-03-10 MED ORDER — IRON SUCROSE 20 MG/ML IV SOLN
200.0000 mg | Freq: Once | INTRAVENOUS | Status: AC
  Administered 2021-03-10: 200 mg via INTRAVENOUS
  Filled 2021-03-10: qty 10

## 2021-03-10 MED ORDER — SODIUM CHLORIDE 0.9 % IV SOLN: 200.0000 mg | Freq: Once | INTRAVENOUS | Status: DC

## 2021-03-10 MED ORDER — SODIUM CHLORIDE 0.9 % IV SOLN
Freq: Once | INTRAVENOUS | Status: AC
  Filled 2021-03-10: qty 250

## 2021-03-10 NOTE — Patient Instructions (Signed)
CANCER CENTER [place] REGIONAL MEDICAL ONCOLOGY   Discharge Instructions: Thank you for choosing [facility] to provide your oncology and hematology care.  If you have a lab appointment with the Cancer Center, please go directly to the Cancer Center and check in at the registration area.  We strive to give you quality time with your provider. You may need to reschedule your appointment if you arrive late (15 or more minutes).  Arriving late affects you and other patients whose appointments are after yours.  Also, if you miss three or more appointments without notifying the office, you may be dismissed from the clinic at the provider's discretion.      For prescription refill requests, have your pharmacy contact our office and allow 72 hours for refills to be completed.    Today you received the following: Venofer.      BELOW ARE SYMPTOMS THAT SHOULD BE REPORTED IMMEDIATELY: *FEVER GREATER THAN 100.4 F (38 C) OR HIGHER *CHILLS OR SWEATING *NAUSEA AND VOMITING THAT IS NOT CONTROLLED WITH YOUR NAUSEA MEDICATION *UNUSUAL SHORTNESS OF BREATH *UNUSUAL BRUISING OR BLEEDING *URINARY PROBLEMS (pain or burning when urinating, or frequent urination) *BOWEL PROBLEMS (unusual diarrhea, constipation, pain near the anus) TENDERNESS IN MOUTH AND THROAT WITH OR WITHOUT PRESENCE OF ULCERS (sore throat, sores in mouth, or a toothache) UNUSUAL RASH, SWELLING OR PAIN  UNUSUAL VAGINAL DISCHARGE OR ITCHING   Items with * indicate a potential emergency and should be followed up as soon as possible or go to the Emergency Department if any problems should occur.  Should you have questions after your visit or need to cancel or reschedule your appointment, please contact CANCER CENTER  REGIONAL MEDICAL ONCOLOGY  [phone] and follow the prompts.  Office hours are 8:00 a.m. to 4:30 p.m. Monday - Friday. Please note that voicemails left after 4:00 p.m. may not be returned until the following  business day.  We are closed weekends and major holidays. You have access to a nurse at all times for urgent questions. Please call the main number to the clinic [phone] and follow the prompts.  For any non-urgent questions, you may also contact your provider using MyChart. We now offer e-Visits for anyone 18 and older to request care online for non-urgent symptoms. For details visit [email or web address].   Also download the MyChart app! Go to the app store, search "MyChart", open the app, select [place], and log in with your MyChart username and password.  Due to Covid, a mask is required upon entering the hospital/clinic. If you do not have a mask, one will be given to you upon arrival. For doctor visits, patients may have 1 support person aged 18 or older with them. For treatment visits, patients cannot have anyone with them due to current Covid guidelines and our immunocompromised population.  

## 2021-03-10 NOTE — Progress Notes (Signed)
Routine Prenatal Care Visit  Subjective  Meredith Hubbard is a 25 y.o. G2P1001 at [redacted]w[redacted]d being seen today for ongoing prenatal care.  She is currently monitored for the following issues for this low-risk pregnancy and has Supervision of other normal pregnancy, antepartum; Marijuana use; and Iron deficiency on their problem list.  ----------------------------------------------------------------------------------- Patient reports occasional contractions.   Contractions: Irregular. Vag. Bleeding: None.  Movement: Present. Denies leaking of fluid.  ----------------------------------------------------------------------------------- The following portions of the patient's history were reviewed and updated as appropriate: allergies, current medications, past family history, past medical history, past social history, past surgical history and problem list. Problem list updated.   Objective  Blood pressure 106/68, height 5' 1.5" (1.562 m), weight 208 lb 9.6 oz (94.6 kg), last menstrual period 06/25/2020. Pregravid weight 185 lb (83.9 kg) Total Weight Gain 23 lb 9.6 oz (10.7 kg) Urinalysis:      Fetal Status: Fetal Heart Rate (bpm): 140 Fundal Height: 37 cm Movement: Present  Presentation: Vertex  General:  Alert, oriented and cooperative. Patient is in no acute distress.  Skin: Skin is warm and dry. No rash noted.   Cardiovascular: Normal heart rate noted  Respiratory: Normal respiratory effort, no problems with respiration noted  Abdomen: Soft, gravid, appropriate for gestational age. Pain/Pressure: Present     Pelvic:  Cervical exam performed Dilation: 1 Effacement (%): 50 Station: -3  Extremities: Normal range of motion.  Edema: None  Mental Status: Normal mood and affect. Normal behavior. Normal judgment and thought content.     Assessment   25 y.o. G2P1001 at [redacted]w[redacted]d by  03/31/2021, by Ultrasound presenting for routine prenatal visit  Plan   pregnancy2  Problems (from 05/16/20 to present)      Problem Noted Resolved   Marijuana use 08/25/2020 by Mirna Mires, CNM No   Overview Signed 08/25/2020 10:45 AM by Mirna Mires, CNM    07/2020- tested poisitive for MJ in early pregnancy. Advised to cease using during the pregnancy.      Supervision of other normal pregnancy, antepartum 08/13/2020 by Mirna Mires, CNM No   Overview Addendum 03/10/2021  2:45 PM by Natale Milch, MD     Nursing Staff Provider  Office Location  [facility] Dating   6 wk [place]  Language  English Anatomy US   incomplete,f/u done- normal female  Flu Vaccine   Genetic Screen  NIPS: declined  TDaP vaccine   01/12/21 Hgb A1C or  GTT Early : n/a Third trimester elevated 1hr, passed 3 hr GTT  Covid  vax x2   LAB RESULTS   Rhogam   n/a Blood Type AB/Positive/-- (05/20 1624)   Feeding Plan  Breast Antibody Negative (05/20 1624)  Contraception  Rubella 1.63 (05/20 1624)  Circumcision  RPR Non Reactive (05/20 1624)   Pediatrician   HBsAg Negative (05/20 1624)   Support Person  Feliz Beam HIV positive  Prenatal Classes  Declined Varicella immune    GBS positive  BTL Consent  N/A    VBAC Consent  N/A Pap  NILM    Hgb Electro   normal hbg    CF  Negative     SMA  Negative                  Gestational age appropriate obstetric precautions including but not limited to vaginal bleeding, contractions, leaking of fluid and fetal movement were reviewed in detail with the patient.    Return in about 1 week (around 03/17/2021) for  ROB in person.  Natale Milch MD [facility], [facility] 03/10/2021, 2:56 PM

## 2021-03-10 NOTE — Patient Instructions (Signed)
Third Trimester of Pregnancy The third trimester of pregnancy is from week 28 through week 40. This is months 7 through 9. The third trimester is a time when the unborn baby (fetus) is growing rapidly. At the end of the ninth month, the fetus is about 20 inches long and weighs 6-10 pounds. Body changes during your third trimester During the third trimester, your body will continue to go through many changes. The changes vary and generally return to normal after your baby is born. Physical changes Your weight will continue to increase. You can expect to gain 25-35 pounds (11-16 kg) by the end of the pregnancy if you begin pregnancy at a normal weight. If you are underweight, you can expect to gain 28-40 lb (about 13-18 kg), and if you are overweight, you can expect to gain 15-25 lb (about 7-11 kg). You may begin to get stretch marks on your hips, abdomen, and breasts. Your breasts will continue to grow and may hurt. A yellow fluid (colostrum) may leak from your breasts. This is the first milk you are producing for your baby. You may have changes in your hair. These can include thickening of your hair, rapid growth, and changes in texture. Some people also have hair loss during or after pregnancy, or hair that feels dry or thin. Your belly button may stick out. You may notice more swelling in your hands, face, or ankles. Health changes You may have heartburn. You may have constipation. You may develop hemorrhoids. You may develop swollen, bulging veins (varicose veins) in your legs. You may have increased body aches in the pelvis, back, or thighs. This is due to weight gain and increased hormones that are relaxing your joints. You may have increased tingling or numbness in your hands, arms, and legs. The skin on your abdomen may also feel numb. You may feel short of breath because of your expanding uterus. Other changes You may urinate more often because the fetus is moving lower into your pelvis  and pressing on your bladder. You may have more problems sleeping. This may be caused by the size of your abdomen, an increased need to urinate, and an increase in your body's metabolism. You may notice the fetus "dropping," or moving lower in your abdomen (lightening). You may have increased vaginal discharge. You may notice that you have pain around your pelvic bone as your uterus distends. Follow these instructions at home: Medicines Follow your health care provider's instructions regarding medicine use. Specific medicines may be either safe or unsafe to take during pregnancy. Do not take any medicines unless approved by your health care provider. Take a prenatal vitamin that contains at least 600 micrograms (mcg) of folic acid. Eating and drinking Eat a healthy diet that includes fresh fruits and vegetables, whole grains, good sources of protein such as meat, eggs, or tofu, and low-fat dairy products. Avoid raw meat and unpasteurized juice, milk, and cheese. These carry germs that can harm you and your baby. Eat 4 or 5 small meals rather than 3 large meals a day. You may need to take these actions to prevent or treat constipation: Drink enough fluid to keep your urine pale yellow. Eat foods that are high in fiber, such as beans, whole grains, and fresh fruits and vegetables. Limit foods that are high in fat and processed sugars, such as fried or sweet foods. Activity Exercise only as directed by your health care provider. Most people can continue their usual exercise routine during pregnancy. Try to   exercise for 30 minutes at least 5 days a week. Stop exercising if you experience contractions in the uterus. Stop exercising if you develop pain or cramping in the lower abdomen or lower back. Avoid heavy lifting. Do not exercise if it is very hot or humid or if you are at a high altitude. If you choose to, you may continue to have sex unless your health care provider tells you not  to. Relieving pain and discomfort Take frequent breaks and rest with your legs raised (elevated) if you have leg cramps or low back pain. Take warm sitz baths to soothe any pain or discomfort caused by hemorrhoids. Use hemorrhoid cream if your health care provider approves. Wear a supportive bra to prevent discomfort from breast tenderness. If you develop varicose veins: Wear support hose as told by your health care provider. Elevate your feet for 15 minutes, 3-4 times a day. Limit salt in your diet. Safety Talk to your health care provider before traveling far distances. Do not use hot tubs, steam rooms, or saunas. Wear your seat belt at all times when driving or riding in a car. Talk with your health care provider if someone is verbally or physically abusive to you. Preparing for birth To prepare for the arrival of your baby: Take prenatal classes to understand, practice, and ask questions about labor and delivery. Visit the hospital and tour the maternity area. Purchase a rear-facing car seat and make sure you know how to install it in your car. Prepare the baby's room or sleeping area. Make sure to remove all pillows and stuffed animals from the baby's crib to prevent suffocation. General instructions Avoid cat litter boxes and soil used by cats. These carry germs that can cause birth defects in the baby. If you have a cat, ask someone to clean the litter box for you. Do not douche or use tampons. Do not use scented sanitary pads. Do not use any products that contain nicotine or tobacco, such as cigarettes, e-cigarettes, and chewing tobacco. If you need help quitting, ask your health care provider. Do not use any herbal remedies, illegal drugs, or medicines that were not prescribed to you. Chemicals in these products can harm your baby. Do not drink alcohol. You will have more frequent prenatal exams during the third trimester. During a routine prenatal visit, your health care provider  will do a physical exam, perform tests, and discuss your overall health. Keep all follow-up visits. This is important. Where to find more information American Pregnancy Association: americanpregnancy.org American College of Obstetricians and Gynecologists: acog.org/en/Womens%20Health/Pregnancy Office on Women's Health: womenshealth.gov/pregnancy Contact a health care provider if you have: A fever. Mild pelvic cramps, pelvic pressure, or nagging pain in your abdominal area or lower back. Vomiting or diarrhea. Bad-smelling vaginal discharge or foul-smelling urine. Pain when you urinate. A headache that does not go away when you take medicine. Visual changes or see spots in front of your eyes. Get help right away if: Your water breaks. You have regular contractions less than 5 minutes apart. You have spotting or bleeding from your vagina. You have severe abdominal pain. You have difficulty breathing. You have chest pain. You have fainting spells. You have not felt your baby move for the time period told by your health care provider. You have new or increased pain, swelling, or redness in an arm or leg. Summary The third trimester of pregnancy is from week 28 through week 40 (months 7 through 9). You may have more problems sleeping.   This can be caused by the size of your abdomen, an increased need to urinate, and an increase in your body's metabolism. You will have more frequent prenatal exams during the third trimester. Keep all follow-up visits. This is important. This information is not intended to replace advice given to you by your health care provider. Make sure you discuss any questions you have with your health care provider. Document Revised: 09/18/2019 Document Reviewed: 07/25/2019 Elsevier Patient Education  2022 [employer] Vaginal Delivery Vaginal delivery means that you give birth by pushing your baby out of your birth canal (vagina). Your health care team will help you  before, during, and after vaginal delivery. Birth experiences are unique for every woman and every pregnancy, and birth experiences vary depending on where you choose to give birth. What are the risks and benefits? Generally, this is safe. However, problems may occur, including: Bleeding. Infection. Damage to other structures such as vaginal tearing. Allergic reactions to medicines. Despite the risks, benefits of vaginal delivery include less risk of bleeding and infection and a shorter recovery time compared to a Cesarean delivery. Cesarean delivery, or C-section, is the surgical delivery of a baby. What happens when I arrive at the birth center or hospital? Once you are in labor and have been admitted into the hospital or birth center, your health care team may: Review your pregnancy history and any concerns that you have. Talk with you about your birth plan and discuss pain control options. Check your blood pressure, breathing, and heartbeat. Assess your baby's heartbeat. Monitor your uterus for contractions. Check whether your bag of water (amniotic sac) has broken (ruptured). Insert an IV into one of your veins. This may be used to give you fluids and medicines. Monitoring Your health care team may assess your contractions (uterine monitoring) and your baby's heart rate (fetal monitoring). You may need to be monitored: Often, but not continuously (intermittently). All the time or for long periods at a time (continuously). Continuous monitoring may be needed if: You are taking certain medicines, such as medicine to relieve pain or make your contractions stronger. You have pregnancy or labor complications. Monitoring may be done by: Placing a special stethoscope or a handheld monitoring device on your abdomen to check your baby's heartbeat and to check for contractions. Placing monitors on your abdomen (external monitors) to record your baby's heartbeat and the frequency and length of  contractions. Placing monitors inside your uterus through your vagina (internal monitors) to record your baby's heartbeat and the frequency, length, and strength of your contractions. Depending on the type of monitor, it may remain in your uterus or on your baby's head until birth. Telemetry. This is a type of continuous monitoring that can be done with external or internal monitors. Instead of having to stay in bed, you are able to move around. Physical exam Your health care team may perform frequent physical exams. This may include: Checking how and where your baby is positioned in your uterus. Checking your cervix to determine: Whether it is thinning out (effacing). Whether it is opening up (dilating). What happens during labor and delivery? Normal labor and delivery is divided into the following three stages: Stage 1 This is the longest stage of labor. Throughout this stage, you will feel contractions. Contractions generally feel mild, infrequent, and irregular at first. They get stronger, more frequent, and more regular as you move through this stage. You may have contractions about every 2-3 minutes. This stage ends when your cervix is   completely dilated to 4 inches (10 cm) and completely effaced. Stage 2 This stage starts once your cervix is completely effaced and dilated and lasts until the delivery of your baby. This is the stage where you will feel an urge to push your baby out of your vagina. You may feel stretching and burning pain, especially when the widest part of your baby's head passes through the vaginal opening (crowning). Once your baby is delivered, the umbilical cord will be clamped and cut. Timing of cutting the cord will depend on your wishes, your baby's health, and your health care provider's practices. Your baby will be placed on your bare chest (skin-to-skin contact) in an upright position and covered with a warm blanket. If you are choosing to breastfeed, watch your  baby for feeding cues, like rooting or sucking, and help the baby to your breast for his or her first feeding. Stage 3 This stage starts immediately after the birth of your baby and ends after you deliver the placenta. This stage may take anywhere from 5 to 30 minutes. After your baby has been delivered, you will feel contractions as your body expels the placenta. These contractions also help your uterus get smaller and reduce bleeding. What can I expect after labor and delivery? After labor is over, you and your baby will be assessed closely until you are ready to go home. Your health care team will teach you how to care for yourself and your baby. You and your baby may be encouraged to stay in the same room (rooming in) during your hospital stay. This will help promote early bonding and successful breastfeeding. Your uterus will be checked and massaged regularly (fundal massage). You may continue to receive fluids and medicines through an IV. You will have some soreness and pain in your abdomen, vagina, and the area of skin between your vaginal opening and your anus (perineum). If an incision was made near your vagina (episiotomy) or if you had some vaginal tearing during delivery, cold compresses may be placed on your episiotomy or your tear. This helps to reduce pain and swelling. It is normal to have vaginal bleeding after delivery. Wear a sanitary pad for vaginal bleeding and discharge. Summary Vaginal delivery means that you will give birth by pushing your baby out of your birth canal (vagina). Your health care team will monitor you and your baby throughout the stages of labor. After you deliver your baby, your health care team will continue to assess you and your baby to ensure you are both recovering as expected after delivery. This information is not intended to replace advice given to you by your health care provider. Make sure you discuss any questions you have with your health care  provider. Document Revised: 03/09/2020 Document Reviewed: 03/09/2020 Else[employer] Patient Education  2022 [employer] Pain Relief During Labor and Delivery Many things can cause pain during labor and delivery, including: Pressure due to the baby moving through the pelvis. Stretching of tissues due to the baby moving through the birth canal. Muscle tension due to anxiety or nervousness. The uterus tightening (contracting)and relaxing to help move the baby. How do I get pain relief during labor and delivery? Discuss your pain relief options with your health care provider during your prenatal visits. Explore the options offered by your hospital or birth center. There are many ways to deal with the pain of labor and delivery. You can try relaxation techniques or doing relaxing activities, taking a warm shower or bath (hydrotherapy),   or other methods. There are also many medicines available to help control pain. Relaxation techniques and activities Practice relaxation techniques or do relaxing activities, such as: Focused breathing. Meditation. Visualization. Aroma therapy. Listening to your favorite music. Hypnosis. Hydrotherapy Take a warm shower or bath. This may: Provide comfort and relaxation. Lessen your feeling of pain. Reduce the amount of pain medicine needed. Shorten the length of labor. Other methods Try doing other things, such as: Getting a massage or having counterpressure on your back. Applying warm packs or ice packs. Changing positions often, moving around, or using a birthing ball. Medicines You may be given: Pain medicine through an IV or an injection into a muscle. Pain medicine inserted into your spinal column. Injections of sterile water just under the skin on your lower back. Nitrous oxide inhalation therapy, also called laughing gas. What kinds of medicine are available for pain relief? There are two kinds of medicines that can be used to relieve pain during  labor and delivery: Analgesics. These medicines decrease pain without causing you to lose feeling or the ability to move your muscles. Anesthetics. These medicines block feeling in the body and can decrease your ability to move freely. Both kinds of medicine can cause minor side effects, such as nausea, trouble concentrating, and sleepiness. They can also affect the baby's heart rate before birth and his or her breathing after birth. For this reason, health care providers are careful about when and how much medicine is given. Which medicines are used to provide pain relief? Common medicines The most common medicines used to help manage pain during labor and delivery include: Opioids. Opioids are medicines that decrease how much pain you feel (perception of pain). These medicines can be given through an IV or may be used with anesthetics to block pain. Epidural analgesia. Epidural analgesia is given through a very thin tube that is inserted into the lower back. Medicine is delivered continuously to the area near your spinal column nerves (epidural space). After having this treatment, you may be able to move your legs, but you will not be able to walk. Depending on the amount and type of medicine given, you may lose all feeling in the lower half of your body, or you may have some sensation, including the urge to push. This treatment can be used to give pain relief for a vaginal birth. Sometimes, a numbing medicine is injected into the spinal fluid when an epidural catheter is placed. This provides for immediate relief but only lasts for 1-2 hours. Once it wears off, the epidural will provide pain relief. This is called a combined spinal-epidural (CSE) block. Intrathecal analgesia (spinal analgesia). Intrathecal analgesia is similar to epidural analgesia, but the medicine is injected into the spinal fluid instead of the epidural space. It is usually only given once. It starts to relieve pain quickly, but the  pain relief lasts only 1-2 hours. Pudendal block. This block is done by injecting numbing medicine through the wall of the vagina and into a nerve in the pelvis. Other medicines Other medicines used to help manage pain during labor and delivery include: Local anesthetics. These are used to numb a small area of the body. They may be used along with another kind of medicine or used to numb the nerves of the vagina, cervix, and perineum during the second stage of labor. Spinal block (spinal anesthesia). Spinal anesthesia is similar to spinal analgesia, but the medicine that is used contains longer-acting numbing medicines and pain medicines. This   type of anesthesia can be used for a cesarean delivery and allows you to stay awake for the birth of your baby. General anesthetics cause you to lose consciousness so you do not feel pain. They are usually only used for an emergency cesarean delivery. These medicines are given through an IV or a mask or both. These medicines are used as part of a procedure or for an emergency delivery. Summary Women have many options to help them manage the pain associated with labor and delivery. You can try doing relaxing activities, taking a warm shower or bath, or other methods. There are also many medicines available to help control pain during labor and delivery. Talk with your health care provider about what options are available to you. This information is not intended to replace advice given to you by your health care provider. Make sure you discuss any questions you have with your health care provider. Document Revised: 02/27/2019 Document Reviewed: 02/27/2019 Elsevier Patient Education  2022 [employer] Augmentation of Labor Augmentation of labor is when steps are taken to stimulate and strengthen contractions of the uterus during labor. This may be done when contractions have slowed or stopped, and the progress of labor and delivery of the baby is  delayed. Before augmentation of labor begins, these factors will be considered: The mother's medical condition and the baby's condition. The size and position of the baby. The size of the birth canal. Tell a health care provider about: Any allergies you have. All medicines you are taking, including vitamins, herbs, eye drops, creams, and over-the-counter medicines. Any problems you or your family members have had with anesthetic medicines. Any surgeries you have had. Any blood disorders you have. Any medical conditions you have. What are the risks? Generally, this is a safe procedure. However, problems may occur, including: Tearing (rupture) of the uterus. Breaking off (abruption) of the placenta. Increased risk of infection for you and your baby. Increased risk of cesarean, forceps, or vacuum delivery. Excessive bleeding after delivery (postpartum hemorrhage). Umbilical cord prolapse. This can cause the umbilical cord to get squeezed during birth. Too much stimulation of the contractions. This can result in continuous, prolonged, or very strong contractions. Your baby could fail to get enough blood flow or oxygen. This can be life-threatening (fetal death). What happens during the procedure? Augmentation of labor may include: Giving you medicine that stimulates contractions (oxytocin). This is given through an IV that is inserted into a vein in your arm. Breaking the fluid-filled sac that surrounds the fetus (amniotic sac). This procedure is called artificial rupture of membranes. This procedure may vary among health care providers and hospitals. Summary Augmentation of labor is when steps are taken to stimulate and strengthen contractions of the uterus during labor. This may be done when contractions have slowed down or stopped, and the progress of labor and delivery of the baby is delayed. Labor augmentation may be done using medicine to stimulate contractions (oxytocin). Or it can be  done by breaking the fluid-filled sac that surrounds the fetus (amniotic sac). Generally, this is a safe procedure. However, problems can occur. Talk with your health care provider about the potential risks and benefits of labor augmentation if this is offered to you. This information is not intended to replace advice given to you by your health care provider. Make sure you discuss any questions you have with your health care provider. Document Revised: 01/23/2020 Document Reviewed: 01/23/2020 Elsevier Patient Education  2022 [employer]  

## 2021-03-12 ENCOUNTER — Encounter: Payer: Medicaid Other | Admitting: Obstetrics

## 2021-03-17 ENCOUNTER — Other Ambulatory Visit: Payer: Self-pay

## 2021-03-17 ENCOUNTER — Inpatient Hospital Stay: Payer: Medicaid Other

## 2021-03-17 ENCOUNTER — Ambulatory Visit (INDEPENDENT_AMBULATORY_CARE_PROVIDER_SITE_OTHER): Payer: Medicaid Other | Admitting: Obstetrics & Gynecology

## 2021-03-17 ENCOUNTER — Encounter: Payer: Self-pay | Admitting: Obstetrics & Gynecology

## 2021-03-17 VITALS — BP 133/83 | HR 94 | Temp 95.9°F | Resp 18

## 2021-03-17 VITALS — BP 118/60 | Wt 211.0 lb

## 2021-03-17 DIAGNOSIS — O99013 Anemia complicating pregnancy, third trimester: Secondary | ICD-10-CM

## 2021-03-17 DIAGNOSIS — Z3A38 38 weeks gestation of pregnancy: Secondary | ICD-10-CM

## 2021-03-17 DIAGNOSIS — E611 Iron deficiency: Secondary | ICD-10-CM

## 2021-03-17 DIAGNOSIS — Z3483 Encounter for supervision of other normal pregnancy, third trimester: Secondary | ICD-10-CM

## 2021-03-17 LAB — POCT URINALYSIS DIPSTICK OB
Glucose, UA: NEGATIVE
POC,PROTEIN,UA: NEGATIVE

## 2021-03-17 MED ORDER — IRON SUCROSE 20 MG/ML IV SOLN
200.0000 mg | Freq: Once | INTRAVENOUS | Status: AC
  Administered 2021-03-17: 200 mg via INTRAVENOUS
  Filled 2021-03-17: qty 10

## 2021-03-17 MED ORDER — SODIUM CHLORIDE 0.9 % IV SOLN
Freq: Once | INTRAVENOUS | Status: AC
  Filled 2021-03-17: qty 250

## 2021-03-17 MED ORDER — SODIUM CHLORIDE 0.9 % IV SOLN: 200.0000 mg | Freq: Once | INTRAVENOUS | Status: DC

## 2021-03-17 NOTE — Patient Instructions (Signed)
First Stage of Labor ?Labor is your body's natural process of moving your baby and other structures, including the placenta and umbilical cord, out of your uterus. There are three stages of labor. How long each stage lasts is different for every person. However, certain events happen during each stage that are the same for everyone. ?The first stage starts when true labor begins. This stage ends when your cervix, which is the opening from your uterus into your vagina, is completely open (dilated). Once your cervix is fully dilated you will be ready to enter the second stage of labor and start pushing. ?First stage of labor ?The first stage of labor begins when you start having regular painful contractions that come at evenly spaced intervals. During this stage, your cervix starts to get thinner and opens in preparation for your baby to pass through. Health care providers measure the dilation of your cervix in centimeters (cm). One centimeter is a little less than one-half of an inch. The first stage ends when your cervix is dilated to 10 cm. The first stage of labor is divided into three phases: ?Early phase. ?Active phase. ?Transitional phase. ?The length of the first stage of labor varies. It may be longer if this is your first pregnancy. You may spend some of this stage at home trying to relax and stay comfortable. ?How does this affect me? ?During the first stage of labor, you will move through three phases. ?What happens in the early phase? ?You will start to have regular contractions that last 30-60 seconds. Contractions may come every 5-20 minutes. Keep track of your contractions and call your health care provider. ?Your water may break (rupture of membranes). This is when the sac of fluid that surrounds your baby breaks. ?You may notice a clear or slightly bloody discharge of mucus (mucus plug) from your vagina. ?What happens in the active phase? ?The active phase usually lasts 3-5 hours. You may go to the  hospital or birth center around this time. During the active phase: ?Your contractions will become stronger, longer, and more uncomfortable. ?Your contractions may last 45-90 seconds and come every 3-5 minutes. ?Your contractions will be painful. Most people feel abdominal pain with contractions, but you may also feel lower back pain. ?What happens in the transitional phase? ?The transitional phase typically lasts from 30 minutes to 2 hours. At the end of this phase, your cervix will be fully dilated to 10 cm. During the transitional phase: ?Contractions will get stronger, longer, and more intense. ?Contractions may last 60-90 seconds and come less than 2 minutes apart. ?You may feel hot flashes, chills, or nausea. ?How does this affect my baby? ?During the first stage of labor, your baby will gradually move down into your birth canal. ?Follow these instructions at home: ? ?When labor first begins, try to stay calm. You are still in the early phase. During this time it's important to rest and stay hydrated. You may want to make some calls and get ready to go to the hospital or birth center. ?When you are in the early phase, try these methods to help ease discomfort: ?Deep breathing and muscle relaxation. ?Taking a walk. ?Taking a warm bath or shower. ?Drink some fluids and have a light snack if you feel like it. ?Keep track of your contractions. ?Based on the plan you created with your health care provider, call when your contractions indicate it is time. ?If your water breaks, note the time, color, and odor of the fluid. ?  Contact a health care provider if: ?Your contractions are strong and regular. ?You have lower back pain or cramping. ?Your water breaks. ?You lose your mucus plug. ?Get help right away if: ?You have a severe headache that does not go away. ?You have changes in your vision. ?You have severe pain in your upper belly. ?You do not feel the baby move. ?You have bright red bleeding. ?Summary ?The first  stage of labor starts when true labor begins, and it ends when your cervix is dilated to 10 cm. ?The first stage of labor has three phases: early, active, and transitional. ?Your baby moves into the birth canal during the first stage of labor. ?Call your health care provider if you think your labor is starting. ?This information is not intended to replace advice given to you by your health care provider. Make sure you discuss any questions you have with your health care provider. ?Document Revised: 08/25/2020 Document Reviewed: 08/25/2020 ?Elsevier Patient Education ? 2022 Elsevier Inc. ? ?

## 2021-03-17 NOTE — Patient Instructions (Signed)
[facility]  Discharge Instructions: Thank you for choosing [facility] to provide your oncology and hematology care.  If you have a lab appointment with the Cancer Center, please go directly to the Cancer Center and check in at the registration area.  Wear comfortable clothing and clothing appropriate for easy access to any Portacath or PICC line.   We strive to give you quality time with your provider. You may need to reschedule your appointment if you arrive late (15 or more minutes).  Arriving late affects you and other patients whose appointments are after yours.  Also, if you miss three or more appointments without notifying the office, you may be dismissed from the clinic at the provider's discretion.      For prescription refill requests, have your pharmacy contact our office and allow 72 hours for refills to be completed.    Today you received the following chemotherapy and/or immunotherapy agents VENOFER      To help prevent nausea and vomiting after your treatment, we encourage you to take your nausea medication as directed.  BELOW ARE SYMPTOMS THAT SHOULD BE REPORTED IMMEDIATELY: *FEVER GREATER THAN 100.4 F (38 C) OR HIGHER *CHILLS OR SWEATING *NAUSEA AND VOMITING THAT IS NOT CONTROLLED WITH YOUR NAUSEA MEDICATION *UNUSUAL SHORTNESS OF BREATH *UNUSUAL BRUISING OR BLEEDING *URINARY PROBLEMS (pain or burning when urinating, or frequent urination) *BOWEL PROBLEMS (unusual diarrhea, constipation, pain near the anus) TENDERNESS IN MOUTH AND THROAT WITH OR WITHOUT PRESENCE OF ULCERS (sore throat, sores in mouth, or a toothache) UNUSUAL RASH, SWELLING OR PAIN  UNUSUAL VAGINAL DISCHARGE OR ITCHING   Items with * indicate a potential emergency and should be followed up as soon as possible or go to the Emergency Department if any problems should occur.  Please show the CHEMOTHERAPY ALERT CARD or IMMUNOTHERAPY ALERT CARD at check-in to  the Emergency Department and triage nurse.  Should you have questions after your visit or need to cancel or reschedule your appointment, please contact [facility]  [phone] and follow the prompts.  Office hours are 8:00 a.m. to 4:30 p.m. Monday - Friday. Please note that voicemails left after 4:00 p.m. may not be returned until the following business day.  We are closed weekends and major holidays. You have access to a nurse at all times for urgent questions. Please call the main number to the clinic [phone] and follow the prompts.  For any non-urgent questions, you may also contact your provider using MyChart. We now offer e-Visits for anyone 18 and older to request care online for non-urgent symptoms. For details visit [email or web address].   Also download the MyChart app! Go to the app store, search "MyChart", open the app, select [facility], and log in with your MyChart username and password.  Due to Covid, a mask is required upon entering the hospital/clinic. If you do not have a mask, one will be given to you upon arrival. For doctor visits, patients may have 1 support person aged 18 or older with them. For treatment visits, patients cannot have anyone with them due to current Covid guidelines and our immunocompromised population.   Iron Sucrose Injection What is this medication? IRON SUCROSE (EYE ern SOO krose) treats low levels of iron (iron deficiency anemia) in people with kidney disease. Iron is a mineral that plays an important role in making red blood cells, which carry oxygen from your lungs to the rest of your body. This medicine may   be used for other purposes; ask your health care provider or pharmacist if you have questions. COMMON BRAND NAME(S): Venofer What should I tell my care team before I take this medication? They need to know if you have any of these conditions: Anemia not caused by low iron levels Heart disease High levels  of iron in the blood Kidney disease Liver disease An unusual or allergic reaction to iron, other medications, foods, dyes, or preservatives Pregnant or trying to get pregnant Breast-feeding How should I use this medication? This medication is for infusion into a vein. It is given in a hospital or clinic setting. Talk to your care team about the use of this medication in children. While this medication may be prescribed for children as young as 2 years for selected conditions, precautions do apply. Overdosage: If you think you have taken too much of this medicine contact a poison control center or emergency room at once. NOTE: This medicine is only for you. Do not share this medicine with others. What if I miss a dose? It is important not to miss your dose. Call your care team if you are unable to keep an appointment. What may interact with this medication? Do not take this medication with any of the following: Deferoxamine Dimercaprol Other iron products This medication may also interact with the following: Chloramphenicol Deferasirox This list may not describe all possible interactions. Give your health care provider a list of all the medicines, herbs, non-prescription drugs, or dietary supplements you use. Also tell them if you smoke, drink alcohol, or use illegal drugs. Some items may interact with your medicine. What should I watch for while using this medication? Visit your care team regularly. Tell your care team if your symptoms do not start to get better or if they get worse. You may need blood work done while you are taking this medication. You may need to follow a special diet. Talk to your care team. Foods that contain iron include: whole grains/cereals, dried fruits, beans, or peas, leafy green vegetables, and organ meats (liver, kidney). What side effects may I notice from receiving this medication? Side effects that you should report to your care team as soon as  possible: Allergic reactions--skin rash, itching, hives, swelling of the face, lips, tongue, or throat Low blood pressure--dizziness, feeling faint or lightheaded, blurry vision Shortness of breath Side effects that usually do not require medical attention (report to your care team if they continue or are bothersome): Flushing Headache Joint pain Muscle pain Nausea Pain, redness, or irritation at injection site This list may not describe all possible side effects. Call your doctor for medical advice about side effects. You may report side effects to FDA at 1-800-FDA-1088. Where should I keep my medication? This medication is given in a hospital or clinic and will not be stored at home. NOTE: This sheet is a summary. It may not cover all possible information. If you have questions about this medicine, talk to your doctor, pharmacist, or health care provider.  2022 Elsevier/Gold Standard (2020-09-04 00:00:00)  

## 2021-03-17 NOTE — Addendum Note (Signed)
Addended by: Donnetta Hail on: 03/17/2021 02:24 PM   Modules accepted: Orders

## 2021-03-17 NOTE — Progress Notes (Signed)
  Subjective  Fetal Movement? yes Contractions? no Leaking Fluid? no Vaginal Bleeding? no Pelvic pressure Objective  BP 118/60   Wt 211 lb (95.7 kg)   LMP 06/25/2020 (Approximate)   BMI 39.22 kg/m  General: NAD Pumonary: no increased work of breathing Abdomen: gravid, non-tender Extremities: no edema Psychiatric: mood appropriate, affect full SVE 2/75/-3 Assessment  25 y.o. G2P1001 at [redacted]w[redacted]d by  03/31/2021, by Ultrasound presenting for routine prenatal visit  Plan   Problem List Items Addressed This Visit      Other   Supervision of other normal pregnancy, antepartum - Primary  Other Visit Diagnoses    [redacted] weeks gestation of pregnancy       Anemia during pregnancy in third trimester        Discussed early labor and sx's to watch for PNV, [facility]  pregnancy2  Problems (from 05/16/20 to present)    Problem Noted Resolved   Marijuana use 08/25/2020 by Mirna Mires, CNM No   Overview Signed 08/25/2020 10:45 AM by Mirna Mires, CNM    07/2020- tested poisitive for MJ in early pregnancy. Advised to cease using during the pregnancy.      Supervision of other normal pregnancy, antepartum 08/13/2020 by Mirna Mires, CNM No   Overview Addendum 03/10/2021  2:45 PM by Natale Milch, MD     Nursing Staff Provider  Office Location  [facility] Dating   6 wk [place]  Language  English Anatomy US   incomplete,f/u done- normal female  Flu Vaccine   Genetic Screen  NIPS: declined  TDaP vaccine   01/12/21 Hgb A1C or  GTT Early : n/a Third trimester elevated 1hr, passed 3 hr GTT  Covid  vax x2   LAB RESULTS   Rhogam   n/a Blood Type AB/Positive/-- (05/20 1624)   Feeding Plan  Breast Antibody Negative (05/20 1624)  Contraception  Rubella 1.63 (05/20 1624)  Circumcision  RPR Non Reactive (05/20 1624)   Pediatrician   HBsAg Negative (05/20 1624)   Support Person  Feliz Beam HIV positive  Prenatal Classes  Declined Varicella immune    GBS positive  BTL Consent  N/A    VBAC Consent   N/A Pap  NILM    Hgb Electro   normal hbg    CF  Negative     SMA  Negative                  Annamarie Major, MD, [facility], [facility] 03/17/2021  2:10 PM

## 2021-03-21 ENCOUNTER — Encounter: Payer: Self-pay | Admitting: Obstetrics and Gynecology

## 2021-03-21 ENCOUNTER — Other Ambulatory Visit: Payer: Self-pay

## 2021-03-21 ENCOUNTER — Inpatient Hospital Stay
Admission: EM | Admit: 2021-03-21 | Discharge: 2021-03-21 | Disposition: A | Discharge status: Home / Self Care | Payer: Medicaid Other | Attending: Obstetrics and Gynecology | Admitting: Obstetrics and Gynecology

## 2021-03-21 DIAGNOSIS — O471 False labor at or after 37 completed weeks of gestation: Secondary | ICD-10-CM | POA: Diagnosis not present

## 2021-03-21 DIAGNOSIS — Z3480 Encounter for supervision of other normal pregnancy, unspecified trimester: Secondary | ICD-10-CM

## 2021-03-21 DIAGNOSIS — Z3A38 38 weeks gestation of pregnancy: Secondary | ICD-10-CM | POA: Insufficient documentation

## 2021-03-21 DIAGNOSIS — F1290 Cannabis use, unspecified, uncomplicated: Secondary | ICD-10-CM

## 2021-03-21 MED ORDER — VALACYCLOVIR HCL 500 MG PO TABS: 500.0000 mg | ORAL_TABLET | Freq: Two times a day (BID) | ORAL | 0 refills | Status: AC

## 2021-03-21 MED ORDER — ACETAMINOPHEN 500 MG PO TABS
1000.0000 mg | ORAL_TABLET | Freq: Once | ORAL | Status: AC
  Administered 2021-03-21: 15:00:00 1000 mg via ORAL
  Filled 2021-03-21: qty 2

## 2021-03-21 NOTE — Final Progress Note (Signed)
Physician Final Progress Note  Patient ID: Meredith Hubbard MRN: [id number] DOB/AGE: 06/29/1995 25 y.o.  Admit date: 03/21/2021 Admitting provider: Conard Novak, MD Discharge date: 03/21/2021   Admission Diagnoses:  1) intrauterine pregnancy at [redacted]w[redacted]d  2) abdominal pain in pregnancy, third trimester  Discharge Diagnoses:  1) intrauterine pregnancy at [redacted]w[redacted]d  2) abdominal pain in pregnancy, third trimester - False labor  History of Present Illness: The patient is a 25 y.o. female G2P1001 at [redacted]w[redacted]d who presents for regular uterine contractions that started earlier today.  She notes no vaginal bleeding, no leaking fluid. She notes +FM. She denies prodromal herpes symptoms and active lesions.   Past Medical History:  Diagnosis Date   Herpes    HSV-2 infection     Past Surgical History:  Procedure Laterality Date   OPEN REDUCTION INTERNAL FIXATION (ORIF) TIBIA/FIBULA FRACTURE Right    age 64    No current facility-administered medications on file prior to encounter.   Current Outpatient Medications on File Prior to Encounter  Medication Sig Dispense Refill   Prenatal MV & Min w/FA-DHA (PRENATAL ADULT GUMMY/DHA/FA PO) Take by mouth.      Allergies  Allergen Reactions   Codeine     Social History   Socioeconomic History   Marital status: Married    Spouse name: Not on file   Number of children: Not on file   Years of education: Not on file   Highest education level: Not on file  Occupational History   Not on file  Tobacco Use   Smoking status: Never   Smokeless tobacco: Never  Vaping Use   Vaping Use: Never used  Substance and Sexual Activity   Alcohol use: Never   Drug use: Not Currently    Types: Marijuana   Sexual activity: Yes  Other Topics Concern   Not on file  Social History Narrative   Not on file   Social Determinants of Health   Financial Resource Strain: Not on file  Food Insecurity: Not on file  Transportation Needs: Not on file  Physical  Activity: Not on file  Stress: Not on file  Social Connections: Not on file  Intimate Partner Violence: Not on file    Family History  Problem Relation Age of Onset   Hypertension Mother    Diabetes Maternal Uncle    Diabetes Other      Review of Systems  Constitutional: Negative.   HENT: Negative.    Eyes: Negative.   Respiratory: Negative.    Cardiovascular: Negative.   Gastrointestinal:  Positive for abdominal pain (contractions).  Genitourinary: Negative.   Musculoskeletal: Negative.   Skin: Negative.   Neurological: Negative.   Psychiatric/Behavioral: Negative.      Physical Exam: BP 128/71   Pulse (!) 106   Temp 98.5 F (36.9 C) (Oral)   Resp 14   Ht 5' 1.5" (1.562 m)   Wt 95.7 kg Comment: 211lbs  LMP 06/25/2020 (Approximate)   BMI 39.22 kg/m   Physical Exam Constitutional:      General: She is not in acute distress.    Appearance: Normal appearance.  Genitourinary:     Genitourinary Comments: SVE: 2/50/-2 per RN x 3 over 4 hours  HENT:     Head: Normocephalic and atraumatic.  Eyes:     General: No scleral icterus.    Conjunctiva/sclera: Conjunctivae normal.  Neurological:     General: No focal deficit present.     Mental Status: She is alert and oriented to person,  place, and time.     Cranial Nerves: No cranial nerve deficit.  Psychiatric:        Mood and Affect: Mood normal.        Behavior: Behavior normal.        Judgment: Judgment normal.    Consults: None  Significant Findings/ Diagnostic Studies: none  Procedures:  NST: Baseline FHR: 135 beats/min Variability: moderate Accelerations: present Decelerations: absent Tocometry: 2-3 q 10 minutes  Interpretation:  INDICATIONS: rule out uterine contractions RESULTS:  A NST procedure was performed with FHR monitoring and a normal baseline established, appropriate time of 20-40 minutes of evaluation, and accels >2 seen w 15x15 characteristics.  Results show a REACTIVE NST.    Hospital  Course: The patient was admitted to Labor and Delivery Triage for observation. She had normal vital signs. The fetal tracing was reactive. She had no change in her cervix over 3 cervical checks. Notably, her cervix was unchanged from her exam at her appointment in clinic 4 days ago.  She was offered to stay for another check. However, she elected to return home with instructions to return should her contractions become stronger, get closer together, she develops vaginal bleeding, a gush of fluid, or decreased fetal movement.    Discharge Condition: good  Disposition: Discharge disposition: 01-Home or Self Care       Diet: Regular diet  Discharge Activity: Activity as tolerated   Allergies as of 03/21/2021       Reactions   Codeine         Medication List     TAKE these medications    PRENATAL ADULT GUMMY/DHA/FA PO Take by mouth.   valACYclovir 500 MG tablet Commonly known as: Valtrex Take 1 tablet (500 mg total) by mouth 2 (two) times daily. What changed:  medication strength how much to take when to take this         Total time spent taking care of this patient: 20 minutes  Signed: Thomasene Mohair, MD  03/21/2021, 8:30 PM

## 2021-03-21 NOTE — Discharge Summary (Signed)
See final progress note. 

## 2021-03-24 ENCOUNTER — Other Ambulatory Visit: Payer: Self-pay

## 2021-03-24 ENCOUNTER — Ambulatory Visit (INDEPENDENT_AMBULATORY_CARE_PROVIDER_SITE_OTHER): Payer: Medicaid Other | Admitting: Obstetrics

## 2021-03-24 VITALS — BP 120/72 | Wt 211.0 lb

## 2021-03-24 DIAGNOSIS — Z3A39 39 weeks gestation of pregnancy: Secondary | ICD-10-CM

## 2021-03-24 DIAGNOSIS — Z3483 Encounter for supervision of other normal pregnancy, third trimester: Secondary | ICD-10-CM

## 2021-03-24 LAB — POCT URINALYSIS DIPSTICK OB
Glucose, UA: NEGATIVE
POC,PROTEIN,UA: NEGATIVE

## 2021-03-24 NOTE — Progress Notes (Signed)
Routine Prenatal Care Visit  Subjective  Meredith Hubbard is a 25 y.o. G2P1001 at [redacted]w[redacted]d being seen today for ongoing prenatal care.  She is currently monitored for the following issues for this low-risk pregnancy and has Supervision of other normal pregnancy, antepartum; Marijuana use; Iron deficiency; and [redacted] weeks gestation of pregnancy on their problem list.  ----------------------------------------------------------------------------------- Patient reports no complaints.  She is eagerly desiring labor. Contractions: Irritability. Vag. Bleeding: None.  Movement: Present. Leaking Fluid denies.  ----------------------------------------------------------------------------------- The following portions of the patient's history were reviewed and updated as appropriate: allergies, current medications, past family history, past medical history, past social history, past surgical history and problem list. Problem list updated.  Objective  Blood pressure 120/72, weight 211 lb (95.7 kg), last menstrual period 06/25/2020. Pregravid weight 185 lb (83.9 kg) Total Weight Gain 26 lb (11.8 kg) Urinalysis: Urine Protein Negative  Urine Glucose Negative  Fetal Status:     Movement: Present     General:  Alert, oriented and cooperative. Patient is in no acute distress.  Skin: Skin is warm and dry. No rash noted.   Cardiovascular: Normal heart rate noted  Respiratory: Normal respiratory effort, no problems with respiration noted  Abdomen: Soft, gravid, appropriate for gestational age. Pain/Pressure: Present     Pelvic:  Cervical exam performed      3cms/50%/-2 gentle sweep by Isabelle Course  Extremities: Normal range of motion.     Mental Status: Normal mood and affect. Normal behavior. Normal judgment and thought content.   Assessment   25 y.o. G2P1001 at [redacted]w[redacted]d by  03/31/2021, by Ultrasound presenting for routine prenatal visit  Plan   pregnancy2  Problems (from 05/16/20 to present)    Problem Noted Resolved    Marijuana use 08/25/2020 by Mirna Mires, CNM No   Overview Signed 08/25/2020 10:45 AM by Mirna Mires, CNM    07/2020- tested poisitive for MJ in early pregnancy. Advised to cease using during the pregnancy.      Supervision of other normal pregnancy, antepartum 08/13/2020 by Mirna Mires, CNM No   Overview Addendum 03/10/2021  2:45 PM by Natale Milch, MD     Nursing Staff Provider  Office Location  [facility] Dating   6 wk [place]  Language  English Anatomy US   incomplete,f/u done- normal female  Flu Vaccine   Genetic Screen  NIPS: declined  TDaP vaccine   01/12/21 Hgb A1C or  GTT Early : n/a Third trimester elevated 1hr, passed 3 hr GTT  Covid  vax x2   LAB RESULTS   Rhogam   n/a Blood Type AB/Positive/-- (05/20 1624)   Feeding Plan  Breast Antibody Negative (05/20 1624)  Contraception  Rubella 1.63 (05/20 1624)  Circumcision  RPR Non Reactive (05/20 1624)   Pediatrician   HBsAg Negative (05/20 1624)   Support Person  Feliz Beam HIV positive  Prenatal Classes  Declined Varicella immune    GBS positive  BTL Consent  N/A    VBAC Consent  N/A Pap  NILM    Hgb Electro   normal hbg    CF  Negative     SMA  Negative                  Term labor symptoms and general obstetric precautions including but not limited to vaginal bleeding, contractions, leaking of fluid and fetal movement were reviewed in detail with the patient. Please refer to After Visit Summary for other counseling recommendations.  She would desired elective  IOL if not delivered by next week.  No follow-ups on file.  Mirna Mires, CNM  03/24/2021 12:06 PM

## 2021-03-25 ENCOUNTER — Encounter: Payer: Self-pay | Admitting: Obstetrics & Gynecology

## 2021-03-25 ENCOUNTER — Observation Stay
Admission: EM | Admit: 2021-03-25 | Discharge: 2021-03-25 | Disposition: A | Discharge status: Home / Self Care | Payer: Medicaid Other | Attending: Obstetrics & Gynecology | Admitting: Obstetrics & Gynecology

## 2021-03-25 ENCOUNTER — Other Ambulatory Visit: Payer: Self-pay

## 2021-03-25 DIAGNOSIS — O26893 Other specified pregnancy related conditions, third trimester: Secondary | ICD-10-CM | POA: Diagnosis present

## 2021-03-25 DIAGNOSIS — F1290 Cannabis use, unspecified, uncomplicated: Secondary | ICD-10-CM

## 2021-03-25 DIAGNOSIS — Z3A39 39 weeks gestation of pregnancy: Secondary | ICD-10-CM | POA: Diagnosis not present

## 2021-03-25 DIAGNOSIS — O26899 Other specified pregnancy related conditions, unspecified trimester: Secondary | ICD-10-CM | POA: Diagnosis present

## 2021-03-25 DIAGNOSIS — R1030 Lower abdominal pain, unspecified: Secondary | ICD-10-CM | POA: Diagnosis not present

## 2021-03-25 DIAGNOSIS — Z3480 Encounter for supervision of other normal pregnancy, unspecified trimester: Secondary | ICD-10-CM

## 2021-03-25 LAB — RUPTURE OF MEMBRANE (ROM)PLUS: Rom Plus: NEGATIVE

## 2021-03-25 LAB — WET PREP, GENITAL
Clue Cells Wet Prep HPF POC: NONE SEEN
Sperm: NONE SEEN
Trich, Wet Prep: NONE SEEN
WBC, Wet Prep HPF POC: <10 — AB (ref <10)
Yeast Wet Prep HPF POC: NONE SEEN

## 2021-03-25 MED ORDER — LIDOCAINE HCL (PF) 1 % IJ SOLN: 30.0000 mL | INTRAMUSCULAR | Status: DC | PRN

## 2021-03-25 MED ORDER — ONDANSETRON HCL 4 MG/2ML IJ SOLN: 4.0000 mg | Freq: Four times a day (QID) | INTRAMUSCULAR | Status: DC | PRN

## 2021-03-25 MED ORDER — ACETAMINOPHEN 325 MG PO TABS: 650.0000 mg | ORAL_TABLET | ORAL | Status: DC | PRN

## 2021-03-25 NOTE — OB Triage Note (Signed)
Patient Discharged home per provider. Pt educated about labor precautions and informed when to return to the ED for further evaluation. Pt instructed to keep all follow up appointments with her provider. AVS given to patient and RN answered all questions and patient has no further questions at this time. Pt discharged home in stable condition with significant other.   

## 2021-03-25 NOTE — Discharge Summary (Signed)
Physician Discharge Summary  Patient ID: Meredith Hubbard MRN: [id number] DOB/AGE: 19-Jan-1996 25 y.o.  Admit date: 03/25/2021 Discharge date: 03/25/2021  Admission Diagnoses:Principal Problem:   Pregnancy with abdominal pain of lower quadrant, antepartum   39 weeks  Discharge Diagnoses:  Principal Problem:   Pregnancy with abdominal pain of lower quadrant, antepartum   Discharged Condition: good  Hospital Course: Seen and examined, allowed to hydrate and ambukate, no change in cervix or signs of labor,  gave discharge instructiuons and labor precautions.  Follow up in office.  Plan IOL 41 weeks.  Consults: None  Significant Diagnostic Studies: A NST procedure was performed with FHR monitoring and a normal baseline established, appropriate time of 20-40 minutes of evaluation, and accels >2 seen w 15x15 characteristics.  Results show a REACTIVE NST.   Treatments: none  Discharge Exam: Blood pressure 126/73, pulse (!) 104, temperature 98 F (36.7 C), resp. rate 18, height 5\' 1"  (1.549 m), weight 95.7 kg, last menstrual period 06/25/2020. General appearance: alert, cooperative, and no distress No change in cervix  Disposition: Discharge disposition: 01-Home or Self Care       Discharge Instructions     Call MD for:   Complete by: As directed    Worsening contractions or pain; leakage of fluid; bleeding.   Diet - low sodium heart healthy   Complete by: As directed    Increase activity slowly   Complete by: As directed       Allergies as of 03/25/2021       Reactions   Codeine         Medication List     TAKE these medications    PRENATAL ADULT GUMMY/DHA/FA PO Take by mouth.   valACYclovir 500 MG tablet Commonly known as: Valtrex Take 1 tablet (500 mg total) by mouth 2 (two) times daily.         Signed: 14/04/2020 03/25/2021, 6:31 PM

## 2021-03-25 NOTE — Progress Notes (Signed)
RN spoke with MD for plan of care. RN to recheck cervix in an hour and if no change then patient will be discharged home.

## 2021-03-25 NOTE — OB Triage Note (Signed)
Pt is a 25yo G2P1 at [redacted]w[redacted]d that presents from ED with c/o LOF since having her membranes swept in the office yesterday. Pt states the leaking started around 1000 this morning and had no odor or color. Pt states positive FM and denies VB. She states ctx are about every 10 minutes. EFM applied and initial fht 155

## 2021-03-25 NOTE — H&P (Signed)
Obstetrics Admission History & Physical   Rupture of Membranes   HPI:  25 y.o. G2P1001 @ [redacted]w[redacted]d (03/31/2021, by Ultrasound). Admitted on 03/25/2021:   Patient Active Problem List   Diagnosis Date Noted   Pregnancy with abdominal pain of lower quadrant, antepartum 03/25/2021   [redacted] weeks gestation of pregnancy 03/21/2021   Iron deficiency 02/18/2021   Marijuana use 08/25/2020   Supervision of other normal pregnancy, antepartum 08/13/2020     Presents for with leakage of fluid and mild ctx pain.  No VB.  Membranes stripped yesterday.   Prenatal care at: at [facility]. Pregnancy complicated by none.  ROS: A review of systems was performed and negative, except as stated in the above HPI. PMHx:  Past Medical History:  Diagnosis Date   Herpes    HSV-2 infection    PSHx:  Past Surgical History:  Procedure Laterality Date   OPEN REDUCTION INTERNAL FIXATION (ORIF) TIBIA/FIBULA FRACTURE Right    age 9   Medications:  Medications Prior to Admission  Medication Sig Dispense Refill Last Dose   Prenatal MV & Min w/FA-DHA (PRENATAL ADULT GUMMY/DHA/FA PO) Take by mouth.   03/25/2021   valACYclovir (VALTREX) 500 MG tablet Take 1 tablet (500 mg total) by mouth 2 (two) times daily. 60 tablet 0 03/25/2021   Allergies: is allergic to codeine. OBHx:  OB History  Gravida Para Term Preterm AB Living  2 1 1     1   SAB IAB Ectopic Multiple Live Births               # Outcome Date GA Lbr Len/2nd Weight Sex Delivery Anes PTL Lv  2 Current           1 Term 03/24/15    M Vag-Spont      03/26/15 except as detailed in HPI.HGD:JMEQASTM/HDQQIWLNLGXQ  No family history of birth defects. Soc Hx: Alcohol: none and Recreational drug use: none  Objective:   Vitals:   03/25/21 1558 03/25/21 1601  BP:  126/73  Pulse: (!) 110 (!) 104  Resp: 18   Temp: 98 F (36.7 C)    Constitutional: Well nourished, well developed female in no acute distress.  HEENT: normal Skin: Warm and dry.  Cardiovascular:Regular rate  and rhythm.   Extremity: trace to 1+ bilateral pedal edema Respiratory: Clear to auscultation bilateral. Normal respiratory effort Abdomen: gravid, ND, FHT present, mild tenderness on exam Back: no CVAT Neuro: DTRs 2+, Cranial nerves grossly intact Psych: Alert and Oriented x3. No memory deficits. Normal mood and affect.  MS: normal gait, normal bilateral lower extremity ROM/strength/stability.  Pelvic exam: is not limited by body habitus EGBUS: within normal limits Vagina: within normal limits and with normal mucosa Cervix: 2/50/-3 Uterus: Uterus demonstrates irritability pattern.  Adnexa: not evaluated  EFM:FHR: 140 bpm, variability: moderate,  accelerations:  Present,  decelerations:  Absent Toco: None   Perinatal info:  Blood type: AB positive Rubella- Immune Varicella -Immune TDaP Given during third trimester of this pregnancy RPR NR / HIV Neg/ HBsAg Neg   Neg Fern Neg ROM PLUS Neg Wet Prep  A NST procedure was performed with FHR monitoring and a normal baseline established, appropriate time of 20-40 minutes of evaluation, and accels >2 seen w 15x15 characteristics.  Results show a REACTIVE NST.   Assessment & Plan:   25 y.o. G2P1001 @ [redacted]w[redacted]d, Admitted on 03/25/2021:Eval for labor Monitor for cervical change OK to ambulate No s/sx ROM based on labs Outpatient manegement if no cervical changes  Annamarie Major, MD, [facility], [facility] 03/25/2021  4:59 PM

## 2021-03-27 ENCOUNTER — Other Ambulatory Visit: Payer: Self-pay

## 2021-03-27 ENCOUNTER — Inpatient Hospital Stay
Admission: EM | Admit: 2021-03-27 | Discharge: 2021-03-29 | DRG: 806 | Disposition: A | Discharge status: Home / Self Care | Payer: Medicaid Other | Attending: Obstetrics and Gynecology | Admitting: Obstetrics and Gynecology

## 2021-03-27 DIAGNOSIS — B009 Herpesviral infection, unspecified: Secondary | ICD-10-CM | POA: Diagnosis not present

## 2021-03-27 DIAGNOSIS — O9832 Other infections with a predominantly sexual mode of transmission complicating childbirth: Secondary | ICD-10-CM | POA: Diagnosis present

## 2021-03-27 DIAGNOSIS — Z20822 Contact with and (suspected) exposure to covid-19: Secondary | ICD-10-CM | POA: Diagnosis present

## 2021-03-27 DIAGNOSIS — Z3A39 39 weeks gestation of pregnancy: Secondary | ICD-10-CM | POA: Diagnosis not present

## 2021-03-27 DIAGNOSIS — O9081 Anemia of the puerperium: Secondary | ICD-10-CM | POA: Diagnosis not present

## 2021-03-27 DIAGNOSIS — O99324 Drug use complicating childbirth: Secondary | ICD-10-CM | POA: Diagnosis present

## 2021-03-27 DIAGNOSIS — A6000 Herpesviral infection of urogenital system, unspecified: Secondary | ICD-10-CM | POA: Diagnosis present

## 2021-03-27 DIAGNOSIS — R030 Elevated blood-pressure reading, without diagnosis of hypertension: Secondary | ICD-10-CM | POA: Diagnosis present

## 2021-03-27 DIAGNOSIS — F1290 Cannabis use, unspecified, uncomplicated: Secondary | ICD-10-CM | POA: Diagnosis not present

## 2021-03-27 DIAGNOSIS — O479 False labor, unspecified: Secondary | ICD-10-CM | POA: Diagnosis present

## 2021-03-27 DIAGNOSIS — O98813 Other maternal infectious and parasitic diseases complicating pregnancy, third trimester: Secondary | ICD-10-CM | POA: Diagnosis not present

## 2021-03-27 DIAGNOSIS — Z3480 Encounter for supervision of other normal pregnancy, unspecified trimester: Secondary | ICD-10-CM

## 2021-03-27 DIAGNOSIS — D62 Acute posthemorrhagic anemia: Secondary | ICD-10-CM | POA: Diagnosis not present

## 2021-03-27 DIAGNOSIS — O26893 Other specified pregnancy related conditions, third trimester: Principal | ICD-10-CM | POA: Diagnosis present

## 2021-03-27 DIAGNOSIS — O99824 Streptococcus B carrier state complicating childbirth: Secondary | ICD-10-CM | POA: Diagnosis present

## 2021-03-27 MED ORDER — SOD CITRATE-CITRIC ACID 500-334 MG/5ML PO SOLN: 30.0000 mL | ORAL | Status: DC | PRN

## 2021-03-27 MED ORDER — ACETAMINOPHEN 325 MG PO TABS: 650.0000 mg | ORAL_TABLET | ORAL | Status: DC | PRN

## 2021-03-27 MED ORDER — LACTATED RINGERS IV SOLN: 500.0000 mL | INTRAVENOUS | Status: DC | PRN

## 2021-03-27 MED ORDER — ONDANSETRON HCL 4 MG/2ML IJ SOLN: 4.0000 mg | Freq: Four times a day (QID) | INTRAMUSCULAR | Status: DC | PRN

## 2021-03-27 MED ORDER — LIDOCAINE HCL (PF) 1 % IJ SOLN
30.0000 mL | INTRAMUSCULAR | Status: DC | PRN
  Filled 2021-03-27: qty 30

## 2021-03-27 MED ORDER — OXYTOCIN-SODIUM CHLORIDE 30-0.9 UT/500ML-% IV SOLN
2.5000 [IU]/h | INTRAVENOUS | Status: DC
  Filled 2021-03-27: qty 1000

## 2021-03-27 MED ORDER — LACTATED RINGERS IV SOLN: INTRAVENOUS | Status: DC

## 2021-03-27 MED ORDER — BUTORPHANOL TARTRATE 1 MG/ML IJ SOLN
1.0000 mg | INTRAMUSCULAR | Status: DC | PRN
  Administered 2021-03-28: 1 mg via INTRAVENOUS
  Filled 2021-03-27: qty 1

## 2021-03-27 MED ORDER — OXYTOCIN BOLUS FROM INFUSION
333.0000 mL | Freq: Once | INTRAVENOUS | Status: AC
  Administered 2021-03-28: 02:00:00 333 mL via INTRAVENOUS

## 2021-03-27 NOTE — OB Triage Note (Signed)
Pt arrives stating that she has been having ctx's all day. Pt is a G2 P0 and denies bleeding or LOF at this time.

## 2021-03-28 ENCOUNTER — Encounter: Payer: Self-pay | Admitting: Obstetrics

## 2021-03-28 ENCOUNTER — Inpatient Hospital Stay: Payer: Medicaid Other | Admitting: Anesthesiology

## 2021-03-28 DIAGNOSIS — O99324 Drug use complicating childbirth: Secondary | ICD-10-CM

## 2021-03-28 DIAGNOSIS — B009 Herpesviral infection, unspecified: Secondary | ICD-10-CM

## 2021-03-28 DIAGNOSIS — O98813 Other maternal infectious and parasitic diseases complicating pregnancy, third trimester: Secondary | ICD-10-CM

## 2021-03-28 DIAGNOSIS — F1290 Cannabis use, unspecified, uncomplicated: Secondary | ICD-10-CM

## 2021-03-28 DIAGNOSIS — Z3A39 39 weeks gestation of pregnancy: Secondary | ICD-10-CM

## 2021-03-28 LAB — URINE DRUG SCREEN, QUALITATIVE (ARMC ONLY)
Amphetamines, Ur Screen: NOT DETECTED
Barbiturates, Ur Screen: NOT DETECTED
Benzodiazepine, Ur Scrn: NOT DETECTED
Cannabinoid 50 Ng, Ur ~~LOC~~: NOT DETECTED
Cocaine Metabolite,Ur ~~LOC~~: NOT DETECTED
MDMA (Ecstasy)Ur Screen: NOT DETECTED
Methadone Scn, Ur: NOT DETECTED
Opiate, Ur Screen: NOT DETECTED
Phencyclidine (PCP) Ur S: NOT DETECTED
Tricyclic, Ur Screen: NOT DETECTED

## 2021-03-28 LAB — TYPE AND SCREEN
ABO/RH(D): AB POS
Antibody Screen: NEGATIVE

## 2021-03-28 LAB — RESP PANEL BY RT-PCR (FLU A&B, COVID) ARPGX2
Influenza A by PCR: NEGATIVE
Influenza B by PCR: NEGATIVE
SARS Coronavirus 2 by RT PCR: NEGATIVE

## 2021-03-28 LAB — RAPID HIV SCREEN (HIV 1/2 AB+AG)
HIV 1/2 Antibodies: NONREACTIVE
HIV-1 P24 Antigen - HIV24: NONREACTIVE

## 2021-03-28 LAB — CBC
HCT: 35.7 % — ABNORMAL LOW (ref 36.0–46.0)
Hemoglobin: 11.5 g/dL — ABNORMAL LOW (ref 12.0–15.0)
MCH: 28.9 pg (ref 26.0–34.0)
MCHC: 32.2 g/dL (ref 30.0–36.0)
MCV: 89.7 fL (ref 80.0–100.0)
Platelets: 206 10*3/uL (ref 150–400)
RBC: 3.98 MIL/uL (ref 3.87–5.11)
RDW: 18.7 % — ABNORMAL HIGH (ref 11.5–15.5)
WBC: 15 10*3/uL — ABNORMAL HIGH (ref 4.0–10.5)
nRBC: 0 % (ref 0.0–0.2)

## 2021-03-28 LAB — RPR: RPR Ser Ql: NONREACTIVE

## 2021-03-28 MED ORDER — PENICILLIN G POT IN DEXTROSE 60000 UNIT/ML IV SOLN
3.0000 10*6.[IU] | INTRAVENOUS | Status: DC
  Filled 2021-03-28 (×2): qty 50

## 2021-03-28 MED ORDER — LIDOCAINE HCL (PF) 1 % IJ SOLN
INTRAMUSCULAR | Status: DC | PRN
  Administered 2021-03-28: 3 mL via SUBCUTANEOUS

## 2021-03-28 MED ORDER — OXYTOCIN 10 UNIT/ML IJ SOLN
INTRAMUSCULAR | Status: AC
  Filled 2021-03-28: qty 2

## 2021-03-28 MED ORDER — FENTANYL-BUPIVACAINE-NACL 0.5-0.125-0.9 MG/250ML-% EP SOLN
EPIDURAL | Status: AC
  Filled 2021-03-28: qty 250

## 2021-03-28 MED ORDER — MISOPROSTOL 200 MCG PO TABS
ORAL_TABLET | ORAL | Status: AC
  Filled 2021-03-28: qty 4

## 2021-03-28 MED ORDER — DIPHENHYDRAMINE HCL 25 MG PO CAPS: 25.0000 mg | ORAL_CAPSULE | Freq: Four times a day (QID) | ORAL | Status: DC | PRN

## 2021-03-28 MED ORDER — ONDANSETRON HCL 4 MG PO TABS: 4.0000 mg | ORAL_TABLET | ORAL | Status: DC | PRN

## 2021-03-28 MED ORDER — LACTATED RINGERS IV SOLN: 500.0000 mL | Freq: Once | INTRAVENOUS | Status: DC

## 2021-03-28 MED ORDER — ONDANSETRON HCL 4 MG/2ML IJ SOLN: 4.0000 mg | INTRAMUSCULAR | Status: DC | PRN

## 2021-03-28 MED ORDER — SODIUM CHLORIDE 0.9 % IV SOLN: 5.0000 10*6.[IU] | Freq: Once | INTRAVENOUS | Status: AC

## 2021-03-28 MED ORDER — EPHEDRINE 5 MG/ML INJ
10.0000 mg | INTRAVENOUS | Status: DC | PRN
  Filled 2021-03-28: qty 2

## 2021-03-28 MED ORDER — PRENATAL MULTIVITAMIN CH
1.0000 | ORAL_TABLET | Freq: Every day | ORAL | Status: DC
  Administered 2021-03-28 – 2021-03-29 (×2): 1 via ORAL
  Filled 2021-03-28 (×2): qty 1

## 2021-03-28 MED ORDER — PHENYLEPHRINE 40 MCG/ML (10ML) SYRINGE FOR IV PUSH (FOR BLOOD PRESSURE SUPPORT)
80.0000 ug | PREFILLED_SYRINGE | INTRAVENOUS | Status: DC | PRN
  Filled 2021-03-28: qty 10

## 2021-03-28 MED ORDER — DIBUCAINE (PERIANAL) 1 % EX OINT: 1.0000 "application " | TOPICAL_OINTMENT | CUTANEOUS | Status: DC | PRN

## 2021-03-28 MED ORDER — IBUPROFEN 600 MG PO TABS
600.0000 mg | ORAL_TABLET | Freq: Four times a day (QID) | ORAL | Status: DC
  Administered 2021-03-28 – 2021-03-29 (×6): 600 mg via ORAL
  Filled 2021-03-28 (×6): qty 1

## 2021-03-28 MED ORDER — ZOLPIDEM TARTRATE 5 MG PO TABS: 5.0000 mg | ORAL_TABLET | Freq: Every evening | ORAL | Status: DC | PRN

## 2021-03-28 MED ORDER — ACETAMINOPHEN 325 MG PO TABS: 650.0000 mg | ORAL_TABLET | ORAL | Status: DC | PRN

## 2021-03-28 MED ORDER — SODIUM CHLORIDE 0.9 % IV SOLN
INTRAVENOUS | Status: AC
  Administered 2021-03-28: 5 10*6.[IU]
  Filled 2021-03-28: qty 5

## 2021-03-28 MED ORDER — FENTANYL-BUPIVACAINE-NACL 0.5-0.125-0.9 MG/250ML-% EP SOLN
12.0000 mL/h | EPIDURAL | Status: DC | PRN
  Administered 2021-03-28: 01:00:00 12 mL/h via EPIDURAL

## 2021-03-28 MED ORDER — DIPHENHYDRAMINE HCL 50 MG/ML IJ SOLN: 12.5000 mg | INTRAMUSCULAR | Status: DC | PRN

## 2021-03-28 MED ORDER — TETANUS-DIPHTH-ACELL PERTUSSIS 5-2.5-18.5 LF-MCG/0.5 IM SUSY: 0.5000 mL | PREFILLED_SYRINGE | Freq: Once | INTRAMUSCULAR | Status: DC

## 2021-03-28 MED ORDER — AMMONIA AROMATIC IN INHA
RESPIRATORY_TRACT | Status: AC
  Filled 2021-03-28: qty 10

## 2021-03-28 MED ORDER — BUPIVACAINE HCL (PF) 0.25 % IJ SOLN
INTRAMUSCULAR | Status: DC | PRN
Start: 2021-03-28 — End: 2021-03-28
  Administered 2021-03-28 (×2): 4 mL via EPIDURAL

## 2021-03-28 MED ORDER — WITCH HAZEL-GLYCERIN EX PADS: 1.0000 "application " | MEDICATED_PAD | CUTANEOUS | Status: DC | PRN

## 2021-03-28 MED ORDER — LIDOCAINE HCL (PF) 1 % IJ SOLN
INTRAMUSCULAR | Status: AC
  Filled 2021-03-28: qty 30

## 2021-03-28 MED ORDER — SIMETHICONE 80 MG PO CHEW: 80.0000 mg | CHEWABLE_TABLET | ORAL | Status: DC | PRN

## 2021-03-28 MED ORDER — COCONUT OIL OIL
1.0000 "application " | TOPICAL_OIL | Status: DC | PRN
  Administered 2021-03-28: 1 via TOPICAL
  Filled 2021-03-28: qty 120

## 2021-03-28 MED ORDER — LIDOCAINE-EPINEPHRINE (PF) 1.5 %-1:200000 IJ SOLN
INTRAMUSCULAR | Status: DC | PRN
  Administered 2021-03-28: 4 mL via EPIDURAL

## 2021-03-28 MED ORDER — BENZOCAINE-MENTHOL 20-0.5 % EX AERO
1.0000 "application " | INHALATION_SPRAY | CUTANEOUS | Status: DC | PRN
  Filled 2021-03-28: qty 56

## 2021-03-28 MED ORDER — DOCUSATE SODIUM 100 MG PO CAPS
100.0000 mg | ORAL_CAPSULE | Freq: Two times a day (BID) | ORAL | Status: DC
  Administered 2021-03-29: 100 mg via ORAL
  Filled 2021-03-28: qty 1

## 2021-03-28 NOTE — Progress Notes (Signed)
Post Partum Day 0 Subjective: no complaints, up ad lib, voiding, and tolerating PO  Objective: Blood pressure 137/81, pulse (!) 101, temperature 98.9 F (37.2 C), temperature source Oral, resp. rate 18, height 5\' 1"  (1.549 m), weight 95.7 kg, last menstrual period 06/25/2020, SpO2 96 %, unknown if currently breastfeeding.  Physical Exam:  General: cooperative, fatigued, no distress, and moderately obese Lochia: appropriate Uterine Fundus: firm Incision: NA DVT Evaluation: No evidence of DVT seen on physical exam. Negative Homan's sign.  Recent Labs    03/28/21 0015  HGB 11.5*  HCT 35.7*    Assessment/Plan: Continue postpartum ordersl Social work consult secondary to MJ use. Support breastfeeding. Anticipate discharge tomorrow afternoon.   LOS: 1 day   14/04/22 03/28/2021, 11:18 AM

## 2021-03-28 NOTE — H&P (Signed)
Meredith Hubbard is a 25 y.o. female presenting for regular contractions that started earlier this evening.. OB History     Gravida  2   Para  1   Term  1   Preterm      AB      Living  1      SAB      IAB      Ectopic      Multiple      Live Births             Past Medical History:  Diagnosis Date   Herpes    HSV-2 infection    Past Surgical History:  Procedure Laterality Date   OPEN REDUCTION INTERNAL FIXATION (ORIF) TIBIA/FIBULA FRACTURE Right    age 58   Family History: family history includes Diabetes in her maternal uncle and another family member; Hypertension in her mother. Social History:  reports that she has never smoked. She has never used smokeless tobacco. She reports current drug use. Drug: Marijuana. She reports that she does not drink alcohol.     Maternal Diabetes: No Genetic Screening: Declined Maternal Ultrasounds/Referrals: Normal Fetal Ultrasounds or other Referrals:  Referred to Materal Fetal Medicine  Maternal Substance Abuse:  Yes:  Type: Marijuana Significant Maternal Medications:  None- Valtrex at 36 weeks Significant Maternal Lab Results:  Group B Strep positive Other Comments:  None  Review of Systems  Constitutional: Negative.   HENT: Negative.    Eyes: Negative.   Respiratory: Negative.    Cardiovascular: Negative.   Gastrointestinal:        Gravid abdomen  Endocrine: Negative.   Genitourinary:  Positive for pelvic pain.       Contracting  Musculoskeletal: Negative.   Allergic/Immunologic: Negative.   Neurological: Negative.   Hematological: Negative.   Psychiatric/Behavioral: Negative.    History Dilation: 4 Effacement (%): 90 Station: -3 Exam by:: Paula Compton CNM Blood pressure 139/71, pulse (!) 175, temperature 97.8 F (36.6 C), temperature source Oral, resp. rate (!) 22, height 5\' 1"  (1.549 m), weight 95.7 kg, last menstrual period 06/25/2020, SpO2 100 %. Maternal Exam:  Uterine Assessment: Contraction  strength is moderate.  Contraction frequency is irregular.  Abdomen: Fetal presentation: vertex Introitus: Normal vulva. Vagina is positive for vaginal discharge.  Pelvis: adequate for delivery.   Cervix: Cervix evaluated by digital exam.    Physical Exam Constitutional:      Appearance: Normal appearance. She is obese.  HENT:     Head: Normocephalic and atraumatic.  Cardiovascular:     Rate and Rhythm: Normal rate and regular rhythm.     Pulses: Normal pulses.     Heart sounds: Normal heart sounds.  Pulmonary:     Effort: Pulmonary effort is normal.     Breath sounds: Normal breath sounds.  Abdominal:     Comments: Gravid abdomen, adipose  Genitourinary:    General: Normal vulva.     Vagina: Vaginal discharge present.  Musculoskeletal:        General: Normal range of motion.     Cervical back: Normal range of motion and neck supple.  Skin:    General: Skin is warm and dry.  Neurological:     General: No focal deficit present.     Mental Status: She is oriented to person, place, and time.  Psychiatric:        Mood and Affect: Mood normal.        Behavior: Behavior normal.   Perineum examined. No visible  lesions noted Prenatal labs: ABO, Rh: --/--/AB POS (12/04 0015) Antibody: NEG (12/04 0015) Rubella: 1.63 (05/20 1624) RPR: Non Reactive (09/08 0946)  HBsAg: Negative (05/20 1624)  HIV: NON REACTIVE (12/04 0015)  GBS: Positive/-- (11/09 1559)   Sterile vaginal exam: 4cms/90%/-3 with bulging bag of waters  Assessment/Plan: 25 year old gravida 2 para 1 at 39 weeks 4 days gestation Active labor  HSV history- taking Valtrex MJ use in pregnancy  Reassuring fetal heart tones  Plan: Admit for labor EFM continuous Routine labs UDS May have IV pain medication. Contact anesthesia for epidural Elevated blood pressure noteed in Observation room- patient si very activem moving around the bed with contractions- Consider PIH labs for repeated elevated Bps Anticipate  SVD.    Mirna Mires 03/28/2021, 1:42 AM

## 2021-03-28 NOTE — Progress Notes (Signed)
Assisted with delivery of anterior right and posterior left shoulders. Infant delivered easily with downward and then upward guidance. Infant placed on maternal chest. Delivery handed off to nurse midwife.   Adelene Idler MD, [facility], [facility] 03/28/2021 3:02 AM

## 2021-03-28 NOTE — Anesthesia Preprocedure Evaluation (Signed)
Anesthesia Evaluation  Patient identified by MRN, date of birth, ID band Patient awake    Reviewed: Allergy & Precautions, H&P , NPO status , Patient's Chart, lab work & pertinent test results, reviewed documented beta blocker date and time   Airway Mallampati: III  TM Distance: >3 FB Neck ROM: full    Dental no notable dental hx. (+) Teeth Intact   Pulmonary    Pulmonary exam normal breath sounds clear to auscultation       Cardiovascular Exercise Tolerance: Good negative cardio ROS   Rhythm:regular Rate:Normal     Neuro/Psych negative neurological ROS  negative psych ROS   GI/Hepatic negative GI ROS, Neg liver ROS,   Endo/Other  negative endocrine ROSdiabetes, Gestational  Renal/GU      Musculoskeletal   Abdominal   Peds  Hematology negative hematology ROS (+)   Anesthesia Other Findings   Reproductive/Obstetrics (+) Pregnancy                             Anesthesia Physical Anesthesia Plan  ASA: 2  Anesthesia Plan: Epidural   Post-op Pain Management:    Induction:   PONV Risk Score and Plan:   Airway Management Planned:   Additional Equipment:   Intra-op Plan:   Post-operative Plan:   Informed Consent: I have reviewed the patients History and Physical, chart, labs and discussed the procedure including the risks, benefits and alternatives for the proposed anesthesia with the patient or authorized representative who has indicated his/her understanding and acceptance.       Plan Discussed with:   Anesthesia Plan Comments:         Anesthesia Quick Evaluation

## 2021-03-28 NOTE — Progress Notes (Signed)
TOC consult pending based on the results of babies urine screen. Mother's urine screen is negative.

## 2021-03-28 NOTE — Anesthesia Procedure Notes (Signed)
Epidural Patient location during procedure: OB  Staffing Anesthesiologist: Piscitello, Cleda Mccreedy, MD Performed: anesthesiologist   Preanesthetic Checklist Completed: patient identified, IV checked, site marked, risks and benefits discussed, surgical consent, monitors and equipment checked, pre-op evaluation and timeout performed  Epidural Patient position: sitting Prep: ChloraPrep Patient monitoring: heart rate, continuous pulse ox and blood pressure Approach: midline Location: L4-L5 Injection technique: LOR saline  Needle:  Needle type: Tuohy  Needle gauge: 17 G Needle length: 9 cm and 9 Needle insertion depth: 6 cm Catheter type: closed end flexible Catheter size: 19 Gauge Catheter at skin depth: 13 cm Test dose: negative and 1.5% lidocaine with Epi 1:200 K  Assessment Sensory level: T10 Events: blood not aspirated, injection not painful, no injection resistance, no paresthesia and negative IV test  Additional Notes 1st attempt Pt. Evaluated and documentation done after procedure finished. Patient identified. Risks/Benefits/Options discussed with patient including but not limited to bleeding, infection, nerve damage, paralysis, failed block, incomplete pain control, headache, blood pressure changes, nausea, vomiting, reactions to medication both or allergic, itching and postpartum back pain. Confirmed with bedside nurse the patient's most recent platelet count. Confirmed with patient that they are not currently taking any anticoagulation, have any bleeding history or any family history of bleeding disorders. Patient expressed understanding and wished to proceed. All questions were answered. Sterile technique was used throughout the entire procedure. Please see nursing notes for vital signs. Test dose was given through epidural catheter and negative prior to continuing to dose epidural or start infusion. Warning signs of high block given to the patient including shortness of  breath, tingling/numbness in hands, complete motor block, or any concerning symptoms with instructions to call for help. Patient was given instructions on fall risk and not to get out of bed. All questions and concerns addressed with instructions to call with any issues or inadequate analgesia.    Patient tolerated the insertion well without immediate complications.Reason for block:procedure for pain

## 2021-03-28 NOTE — Discharge Summary (Signed)
Postpartum Discharge Summary  Date of Service updated12/07/2020     Patient Name: Meredith Hubbard DOB: [birth year] MRN: [id number]  Date of admission: 03/27/2021 Delivery date:03/28/2021  Delivering provider: Imagene Riches  Date of discharge: 03/29/2021  Admitting diagnosis: Uterine contractions [O47.9] Intrauterine pregnancy: 109w4d    Secondary diagnosis:  Active Problems:   Normal spontaneous vaginal delivery   Postpartum care following vaginal delivery  Additional problems: none    Discharge diagnosis: Term Pregnancy Delivered                                              Post partum procedures: none Augmentation: N/A Complications: None  Hospital course: Onset of Labor With Vaginal Delivery      25y.o. yo G2P1001 at 366w4das admitted in Active Labor on 03/27/2021. Patient had an uncomplicated labor course as follows:  Membrane Rupture Time/Date: 2:00 AM ,03/28/2021   Delivery Method:Vaginal, Spontaneous  Episiotomy: None  Lacerations:  Periurethral  Patient had an uncomplicated postpartum course.  She is ambulating, tolerating a regular diet, passing flatus, and urinating well. Patient is discharged home in stable condition on 03/29/21.  Newborn Data: Birth date:03/28/2021  Birth time:2:13 AM  Gender:Female  Living status:Living  Apgars:8 ,9  Weight:3660 g   Magnesium Sulfate received: No BMZ received: No Rhophylac:N/A MMR:No T-DaP:Given prenatally Flu: No Transfusion:No  Physical exam  Vitals:   03/28/21 1555 03/28/21 1938 03/28/21 2346 03/29/21 0738  BP: 123/79 129/77 120/71 111/82  Pulse: 97 89 88 86  Resp: 18 18 18 20   Temp: 97.8 F (36.6 C) 98.6 F (37 C) 98.3 F (36.8 C) 97.7 F (36.5 C)  TempSrc: Oral Oral Oral Oral  SpO2: 97% 100% 100% 99%  Weight:      Height:       General: alert Lochia: appropriate Uterine Fundus: firm, midline at U Incision: N/A DVT Evaluation: No evidence of DVT seen on physical exam. Negative Homan's  sign. Labs: Lab Results  Component Value Date   WBC 13.0 (H) 03/29/2021   HGB 10.5 (L) 03/29/2021   HCT 32.8 (L) 03/29/2021   MCV 90.6 03/29/2021   PLT 174 03/29/2021   CMP Latest Ref Rng & Units 08/10/2020  Glucose 70 - 99 mg/dL 70  BUN 6 - 20 mg/dL 10  Creatinine 0.44 - 1.00 mg/dL 0.65  Sodium 135 - 145 mmol/L 133(L)  Potassium 3.5 - 5.1 mmol/L 3.8  Chloride 98 - 111 mmol/L 102  CO2 22 - 32 mmol/L 22  Calcium 8.9 - 10.3 mg/dL 9.0  Total Protein 6.5 - 8.1 g/dL -  Total Bilirubin 0.3 - 1.2 mg/dL -  Alkaline Phos 38 - 126 U/L -  AST 15 - 41 U/L -  ALT 0 - 44 U/L -   Edinburgh Score: Edinburgh Postnatal Depression Scale Screening Tool 03/28/2021  I have been able to laugh and see the funny side of things. 0  I have looked forward with enjoyment to things. 0  I have blamed myself unnecessarily when things went wrong. 1  I have been anxious or worried for no good reason. 0  I have felt scared or panicky for no good reason. 0  Things have been getting on top of me. 0  I have been so unhappy that I have had difficulty sleeping. 0  I have felt sad or miserable. 0  I have been so unhappy that I have been crying. 0  The thought of harming myself has occurred to me. 0  Edinburgh Postnatal Depression Scale Total 1      After visit meds:  Allergies as of 03/29/2021       Reactions   Codeine         Medication List     TAKE these medications    acetaminophen 325 MG tablet Commonly known as: Tylenol Take 2 tablets (650 mg total) by mouth every 4 (four) hours as needed (for pain scale < 4).   ibuprofen 600 MG tablet Commonly known as: ADVIL Take 1 tablet (600 mg total) by mouth every 6 (six) hours.   PRENATAL ADULT GUMMY/DHA/FA PO Take by mouth.   valACYclovir 500 MG tablet Commonly known as: Valtrex Take 1 tablet (500 mg total) by mouth 2 (two) times daily.               Discharge Care Instructions  (From admission, onward)           Start      Ordered   03/29/21 0000  Discharge wound care:       Comments: SHOWER DAILY Wash incision gently with soap and water.  Call office with any drainage, redness, or firmness of the incision.   03/29/21 1224             Discharge home in stable condition Infant Feeding: Breast Infant Disposition:home with mother Discharge instruction: per After Visit Summary and Postpartum booklet. Activity: Advance as tolerated. Pelvic rest for 6 weeks.  Diet: routine diet Anticipated Birth Control: Unsure Postpartum Appointment:6 weeks Additional Postpartum F/U:  PRN Future Appointments: Future Appointments  Date Time Provider [place]  03/31/2021 10:10 AM Imagene Riches, CNM [facility] None  05/19/2021 10:30 AM [facility] None  05/19/2021 11:00 AM Cammie Sickle, MD [facility] None  05/19/2021 11:30 AM [facility] INFUSION CHAIR 5 [facility] None   Follow up Visit:  Follow-up Information     Imagene Riches, CNM. Schedule an appointment as soon as possible for a visit in 6 week(s).   Specialties: Obstetrics, Gynecology Why: please make an appointment for a 6 week post delivery physical with [place]. If you want to have an IUD or Nexplanon placed, tell the office scheduler so they will allow enough time in your visit for placement. Contact information: [address] [place] [address] [phone]                     03/29/2021 [place], CNM

## 2021-03-29 ENCOUNTER — Encounter: Payer: Self-pay | Admitting: Obstetrics

## 2021-03-29 LAB — CBC
HCT: 32.8 % — ABNORMAL LOW (ref 36.0–46.0)
Hemoglobin: 10.5 g/dL — ABNORMAL LOW (ref 12.0–15.0)
MCH: 29 pg (ref 26.0–34.0)
MCHC: 32 g/dL (ref 30.0–36.0)
MCV: 90.6 fL (ref 80.0–100.0)
Platelets: 174 10*3/uL (ref 150–400)
RBC: 3.62 MIL/uL — ABNORMAL LOW (ref 3.87–5.11)
RDW: 18.7 % — ABNORMAL HIGH (ref 11.5–15.5)
WBC: 13 10*3/uL — ABNORMAL HIGH (ref 4.0–10.5)
nRBC: 0 % (ref 0.0–0.2)

## 2021-03-29 MED ORDER — IBUPROFEN 600 MG PO TABS: 600.0000 mg | ORAL_TABLET | Freq: Four times a day (QID) | ORAL | 0 refills | Status: DC

## 2021-03-29 MED ORDER — PRENATAL MULTIVITAMIN CH: 1.0000 | ORAL_TABLET | Freq: Every day | ORAL | Status: DC

## 2021-03-29 MED ORDER — ACETAMINOPHEN 325 MG PO TABS: 650.0000 mg | ORAL_TABLET | ORAL | Status: DC | PRN

## 2021-03-29 NOTE — Anesthesia Postprocedure Evaluation (Signed)
Anesthesia Post Note  Patient: Meredith Hubbard  Procedure(s) Performed: AN AD HOC LABOR EPIDURAL  Patient location during evaluation: Mother Baby Anesthesia Type: Epidural Level of consciousness: awake and alert Pain management: pain level controlled Vital Signs Assessment: post-procedure vital signs reviewed and stable Respiratory status: spontaneous breathing, nonlabored ventilation and respiratory function stable Cardiovascular status: stable Postop Assessment: no headache, no backache and epidural receding Anesthetic complications: no   No notable events documented.   Last Vitals:  Vitals:   03/28/21 1938 03/28/21 2346  BP: 129/77 120/71  Pulse: 89 88  Resp: 18 18  Temp: 37 C 36.8 C  SpO2: 100% 100%    Last Pain:  Vitals:   03/29/21 0357  TempSrc:   PainSc: 0-No pain                 Lonell Stamos Lawerance Cruel

## 2021-03-29 NOTE — Lactation Note (Signed)
This note was copied from a baby's chart. Lactation Consultation Note  Patient Name: Meredith Hubbard [id number] Date: 03/29/2021 Reason for consult: Initial assessment;Term Age:26 hours  Initial lactation visit for P2 mom who delivered vaginally 32hrs ago. Baby has been exclusively breastfeeding; wet and stool diapers documented.  Mom was GBS+ with 1 dose of treatment; possible discharge today.  Mom breastfed her first (now 82yrs ol) for 3 months. Her overall concern was breastfeeding was time consuming with just her and no other help. She plans to breastfeed longer with Meredith Hubbard.   Mom reports some nipple tenderness; we discussed transient nipple tenderness and use of coconut oil to aid in this. Encouraged deep latch and reviewed position and alignment of baby to help minimize discomfort. Reviewed milk supply and demand, growth spurts and cluster feeding, and normal course of lactation.  Encouraged continued feeding on demand with early cues and tracking output.   Information for outpatient lactation services and community breastfeeding resources given. Encouraged to call out with questions and for ongoing BF support as needed.  Maternal Data Has patient been taught Hand Expression?: Yes Does the patient have breastfeeding experience prior to this delivery?: Yes How long did the patient breastfeed?: 3 months  Feeding Mother's Current Feeding Choice: Breast Milk  LATCH Score                    Lactation Tools Discussed/Used    Interventions Interventions: Breast feeding basics reviewed;Hand express;Support pillows;Education  Discharge Discharge Education: Engorgement and breast care;Outpatient recommendation Pump: Personal  Consult Status Consult Status: PRN    Danford Bad 03/29/2021, 10:32 AM

## 2021-03-29 NOTE — Progress Notes (Signed)
Subjective:  Madalen is doing well- is appropriately tired. Breasting feeding independently.  Pain well managed with PRN medications. Urinating without difficulty, has not stooled yet. Pleased with birth experience.   Objective:  Vital signs in last 24 hours: Temp:  [97.7 F (36.5 C)-99 F (37.2 C)] 97.7 F (36.5 C) (12/05 0738) Pulse Rate:  [86-98] 86 (12/05 0738) Resp:  [18-20] 20 (12/05 0738) BP: (111-134)/(71-82) 111/82 (12/05 0738) SpO2:  [97 %-100 %] 99 % (12/05 0738)    General: NAD Pulmonary: no increased work of breathing Abdomen: non-distended, non-tender, fundus firm at level of umbilicus Extremities: no edema, no erythema, no tenderness  Results for orders placed or performed during the hospital encounter of 03/27/21 (from the past 72 hour(s))  Type and screen [facility]     Status: None   Collection Time: 03/28/21 12:15 AM  Result Value Ref Range   ABO/RH(D) AB POS    Antibody Screen NEG    Sample Expiration      03/31/2021,2359 Performed at [facility], [address] [place], [place] [address]   RPR     Status: None   Collection Time: 03/28/21 12:15 AM  Result Value Ref Range   RPR Ser Ql NON REACTIVE NON REACTIVE    Comment: Performed at [facility], [address], [place], [place] [address]  Urine Drug Screen, Qualitative ([facility] only)     Status: None   Collection Time: 03/28/21 12:15 AM  Result Value Ref Range   Tricyclic, Ur Screen NONE DETECTED NONE DETECTED   Amphetamines, Ur Screen NONE DETECTED NONE DETECTED   MDMA (Ecstasy)Ur Screen NONE DETECTED NONE DETECTED   Cocaine Metabolite,Ur [place] NONE DETECTED NONE DETECTED   Opiate, Ur Screen NONE DETECTED NONE DETECTED   Phencyclidine (PCP) Ur S NONE DETECTED NONE DETECTED   Cannabinoid 50 Ng, Ur [place] NONE DETECTED NONE DETECTED   Barbiturates, Ur Screen NONE DETECTED NONE DETECTED   Benzodiazepine, Ur Scrn NONE DETECTED NONE DETECTED   Methadone Scn, Ur NONE  DETECTED NONE DETECTED    Comment: (NOTE) Tricyclics + metabolites, urine    Cutoff 1000 ng/mL Amphetamines + metabolites, urine  Cutoff 1000 ng/mL MDMA (Ecstasy), urine              Cutoff 500 ng/mL Cocaine Metabolite, urine          Cutoff 300 ng/mL Opiate + metabolites, urine        Cutoff 300 ng/mL Phencyclidine (PCP), urine         Cutoff 25 ng/mL Cannabinoid, urine                 Cutoff 50 ng/mL Barbiturates + metabolites, urine  Cutoff 200 ng/mL Benzodiazepine, urine              Cutoff 200 ng/mL Methadone, urine                   Cutoff 300 ng/mL  The urine drug screen provides only a preliminary, unconfirmed analytical test result and should not be used for non-medical purposes. Clinical consideration and professional judgment should be applied to any positive drug screen result due to possible interfering substances. A more specific alternate chemical method must be used in order to obtain a confirmed analytical result. Gas chromatography / mass spectrometry (GC/MS) is the preferred confirm atory method. Performed at [facility], [address] [place], [place] [address]   Rapid HIV screen (HIV 1/2 Ab+Ag) ([facility] Only)  Status: None   Collection Time: 03/28/21 12:15 AM  Result Value Ref Range   HIV-1 P24 Antigen - HIV24 NON REACTIVE NON REACTIVE    Comment: (NOTE) Detection of p24 may be inhibited by biotin in the sample, causing false negative results in acute infection.    HIV 1/2 Antibodies NON REACTIVE NON REACTIVE   Interpretation (HIV Ag Ab)      A non reactive test result means that HIV 1 or HIV 2 antibodies and HIV 1 p24 antigen were not detected in the specimen.    Comment: Performed at [facility], [address] [place], [place] [address]  Resp Panel by RT-PCR (Flu A&B, Covid) Nasopharyngeal Swab     Status: None   Collection Time: 03/28/21 12:15 AM   Specimen: Nasopharyngeal Swab; Nasopharyngeal(NP) swabs in vial transport medium   Result Value Ref Range   SARS Coronavirus 2 by RT PCR NEGATIVE NEGATIVE    Comment: (NOTE) SARS-CoV-2 target nucleic acids are NOT DETECTED.  The SARS-CoV-2 RNA is generally detectable in upper respiratory specimens during the acute phase of infection. The lowest concentration of SARS-CoV-2 viral copies this assay can detect is 138 copies/mL. A negative result does not preclude SARS-Cov-2 infection and should not be used as the sole basis for treatment or other patient management decisions. A negative result may occur with  improper specimen collection/handling, submission of specimen other than nasopharyngeal swab, presence of viral mutation(s) within the areas targeted by this assay, and inadequate number of viral copies(<138 copies/mL). A negative result must be combined with clinical observations, patient history, and epidemiological information. The expected result is Negative.  Fact Sheet for Patients:  BloggerCourse.com  Fact Sheet for Healthcare Providers:  SeriousBroker.it  This test is no t yet approved or cleared by the [place] FDA and  has been authorized for detection and/or diagnosis of SARS-CoV-2 by FDA under an Emergency Use Authorization (EUA). This EUA will remain  in effect (meaning this test can be used) for the duration of the COVID-19 declaration under Section 564(b)(1) of the Act, 21 U.S.C.section 360bbb-3(b)(1), unless the authorization is terminated  or revoked sooner.       Influenza A by PCR NEGATIVE NEGATIVE   Influenza B by PCR NEGATIVE NEGATIVE    Comment: (NOTE) The Xpert Xpress SARS-CoV-2/FLU/RSV plus assay is intended as an aid in the diagnosis of influenza from Nasopharyngeal swab specimens and should not be used as a sole basis for treatment. Nasal washings and aspirates are unacceptable for Xpert Xpress SARS-CoV-2/FLU/RSV testing.  Fact Sheet for  Patients: BloggerCourse.com  Fact Sheet for Healthcare Providers: SeriousBroker.it  This test is not yet approved or cleared by the [place] FDA and has been authorized for detection and/or diagnosis of SARS-CoV-2 by FDA under an Emergency Use Authorization (EUA). This EUA will remain in effect (meaning this test can be used) for the duration of the COVID-19 declaration under Section 564(b)(1) of the Act, 21 U.S.C. section 360bbb-3(b)(1), unless the authorization is terminated or revoked.  Performed at [facility], [address] [place], [place] [address]   CBC     Status: Abnormal   Collection Time: 03/28/21 12:15 AM  Result Value Ref Range   WBC 15.0 (H) 4.0 - 10.5 K/uL   RBC 3.98 3.87 - 5.11 MIL/uL   Hemoglobin 11.5 (L) 12.0 - 15.0 g/dL   HCT 06.2 (L) 37.6 - 28.3 %   MCV 89.7 80.0 - 100.0 fL   MCH 28.9 26.0 - 34.0  pg   MCHC 32.2 30.0 - 36.0 g/dL   RDW 50.9 (H) 32.6 - 71.2 %   Platelets 206 150 - 400 K/uL   nRBC 0.0 0.0 - 0.2 %    Comment: Performed at [facility], [address] [place], [place] [address]  CBC     Status: Abnormal   Collection Time: 03/29/21  6:16 AM  Result Value Ref Range   WBC 13.0 (H) 4.0 - 10.5 K/uL   RBC 3.62 (L) 3.87 - 5.11 MIL/uL   Hemoglobin 10.5 (L) 12.0 - 15.0 g/dL   HCT 98.3 (L) 38.2 - 50.5 %   MCV 90.6 80.0 - 100.0 fL   MCH 29.0 26.0 - 34.0 pg   MCHC 32.0 30.0 - 36.0 g/dL   RDW 39.7 (H) 67.3 - 41.9 %   Platelets 174 150 - 400 K/uL   nRBC 0.0 0.0 - 0.2 %    Comment: Performed at [facility], [address] [place], [place] [address]    Assessment:   25 y.o. [phone] postpartum day # 1  Plan:    1) Acute blood loss anemia - hemodynamically stable and asymptomatic - po ferrous sulfate  2) Blood Type --/--/AB POS (12/04 0015) / Rubella 1.63 (05/20 1624) / Varicella Immune  3) TDAP status up to date  4) Feeding plan breast  5) Disposition,  routine PP care, discharge 12/6  Carie Caddy, CNM  [facility], [facility] 03/29/2021, 9:34 AM

## 2021-03-29 NOTE — Progress Notes (Signed)
Pt discharged with infant.  Discharge instructions, prescriptions and follow up appointment given to and reviewed with pt. Pt verbalized understanding. Escorted out by auxillary. 

## 2021-03-31 ENCOUNTER — Encounter: Payer: Medicaid Other | Admitting: Obstetrics

## 2021-04-09 ENCOUNTER — Encounter: Payer: Self-pay | Admitting: Obstetrics

## 2021-04-12 NOTE — Telephone Encounter (Signed)
Patient is scheduled for 12/20 with MMF

## 2021-04-13 ENCOUNTER — Ambulatory Visit: Payer: Medicaid Other | Admitting: Obstetrics

## 2021-05-10 ENCOUNTER — Encounter: Payer: Self-pay | Admitting: Obstetrics

## 2021-05-10 ENCOUNTER — Ambulatory Visit (INDEPENDENT_AMBULATORY_CARE_PROVIDER_SITE_OTHER): Payer: Medicaid Other | Admitting: Obstetrics

## 2021-05-10 ENCOUNTER — Other Ambulatory Visit: Payer: Self-pay

## 2021-05-10 NOTE — Progress Notes (Signed)
Postpartum Visit  Chief Complaint:  Chief Complaint  Patient presents with   Postpartum Care    History of Present Illness: Patient is a 26 y.o. [id number] presents for postpartum visit.She had a vaginal birth and reports she is doing well. Is breastfeeding.  Date of delivery: 03/28/2021 Type of delivery: Vaginal delivery - Vacuum or forceps assisted  no Episiotomy No.  Laceration: small periurethral laceration  Pregnancy or labor problems:  no Any problems since the delivery:  no  Newborn Details:  SINGLETON :  1. Baby's name: female. Birth weight: 3660 Maternal Details:  Breast Feeding:  yes Post partum depression/anxiety noted:  no Edinburgh Post-Partum Depression Score:  3  Date of last PAP: 07/2019  normal   Past Medical History:  Diagnosis Date   Herpes    HSV-2 infection    Normal spontaneous vaginal delivery 03/28/2021    Past Surgical History:  Procedure Laterality Date   OPEN REDUCTION INTERNAL FIXATION (ORIF) TIBIA/FIBULA FRACTURE Right    age 54    Prior to Admission medications   Not on File    Allergies  Allergen Reactions   Codeine      Social History   Socioeconomic History   Marital status: Married    Spouse name: Feliz Beam   Number of children: Not on file   Years of education: Not on file   Highest education level: Not on file  Occupational History   Not on file  Tobacco Use   Smoking status: Never   Smokeless tobacco: Never  Vaping Use   Vaping Use: Never used  Substance and Sexual Activity   Alcohol use: Never   Drug use: Yes    Types: Marijuana   Sexual activity: Yes  Other Topics Concern   Not on file  Social History Narrative   Not on file   Social Determinants of Health   Financial Resource Strain: Not on file  Food Insecurity: Not on file  Transportation Needs: Not on file  Physical Activity: Not on file  Stress: Not on file  Social Connections: Not on file  Intimate Partner Violence: Not on file    Family History   Problem Relation Age of Onset   Hypertension Mother    Diabetes Maternal Uncle    Diabetes Other     Review of Systems  Constitutional: Negative.   HENT: Negative.    Eyes: Negative.   Respiratory: Negative.    Cardiovascular: Negative.   Gastrointestinal: Negative.   Genitourinary: Negative.   Musculoskeletal: Negative.   Skin: Negative.   Neurological: Negative.   Endo/Heme/Allergies: Negative.   Psychiatric/Behavioral: Negative.      Physical Exam BP 122/74    Ht 5\' 1"  (1.549 m)    Wt 197 lb (89.4 kg)    Breastfeeding Yes    BMI 37.22 kg/m   Physical Exam Constitutional:      Appearance: Normal appearance.  Genitourinary:     Vulva and rectum normal.     Genitourinary Comments: Normal external anatomy. No lesions or irritation. No noticeable dishcharge. Uterus is anteverted, non enlarged, mobile  HENT:     Head: Normocephalic and atraumatic.  Cardiovascular:     Rate and Rhythm: Normal rate and regular rhythm.     Pulses: Normal pulses.     Heart sounds: Normal heart sounds.  Pulmonary:     Effort: Pulmonary effort is normal.     Breath sounds: Normal breath sounds.  Abdominal:     Palpations: Abdomen is soft.  Musculoskeletal:  General: Normal range of motion.     Cervical back: Normal range of motion and neck supple.  Neurological:     General: No focal deficit present.     Mental Status: She is alert and oriented to person, place, and time.  Skin:    General: Skin is warm and dry.  Psychiatric:        Mood and Affect: Mood normal.        Behavior: Behavior normal.     Female Chaperone present during breast and/or pelvic exam.  Assessment: 27 y.o. [id number] presenting for 6 week postpartum visit  Plan: Problem List Items Addressed This Visit       Other   Postpartum care following vaginal delivery - Primary     1) Contraception Education given regarding options for contraception, including IUD placement.as she was undecided at the beginning  of this visit. We will schedule her for Mirena placement over the next few weeks.  2)  Pap - ASCCP guidelines and rational discussed.  Patient opts for annual screening interval  3) Patient underwent screening for postpartum depression with no concerns noted.  4) Follow up 1 year for routine annual exam and in 2 weeks forIUD.  Mirna Mires, CNM  05/10/2021 1:14 PM   05/10/2021 1:12 PM

## 2021-05-18 ENCOUNTER — Ambulatory Visit (HOSPITAL_COMMUNITY)
Admission: EM | Admit: 2021-05-18 | Discharge: 2021-05-18 | Disposition: A | Discharge status: Home / Self Care | Payer: Medicaid Other | Attending: Student | Admitting: Student

## 2021-05-18 ENCOUNTER — Encounter (HOSPITAL_COMMUNITY): Payer: Self-pay

## 2021-05-18 ENCOUNTER — Other Ambulatory Visit: Payer: Self-pay

## 2021-05-18 DIAGNOSIS — H6593 Unspecified nonsuppurative otitis media, bilateral: Secondary | ICD-10-CM

## 2021-05-18 DIAGNOSIS — J029 Acute pharyngitis, unspecified: Secondary | ICD-10-CM | POA: Diagnosis not present

## 2021-05-18 MED ORDER — DM-GUAIFENESIN ER 30-600 MG PO TB12: 1.0000 | ORAL_TABLET | Freq: Two times a day (BID) | ORAL | 0 refills | Status: DC

## 2021-05-18 MED ORDER — TRIAMCINOLONE ACETONIDE 55 MCG/ACT NA AERO: 2.0000 | INHALATION_SPRAY | Freq: Every day | NASAL | 1 refills | Status: DC

## 2021-05-18 NOTE — Discharge Instructions (Signed)
-  Nasacort nasal spray once daily while symptoms persist -Mucinex DM up to twice daily, this is over-the-counter but I also sent a prescription -With a virus, you're typically contagious for 5-7 days, or as long as you're having fevers.

## 2021-05-18 NOTE — ED Provider Notes (Signed)
[facility]    CSN: [id number] Arrival date & time: 05/18/21  [id number]      History   Chief Complaint Chief Complaint  Patient presents with   Sore Throat    HPI Meredith Hubbard is a 26 y.o. female presenting with sore throat x3 days, bilateral ear pain x2 days.  Medical history noncontributory.  Denies fevers, trouble swallowing, nausea/vomiting, cough though she is clearing her throat.  Has attempted an over-the-counter cough medicine.  HPI  Past Medical History:  Diagnosis Date   Herpes    HSV-2 infection    Normal spontaneous vaginal delivery 03/28/2021    Patient Active Problem List   Diagnosis Date Noted   Postpartum care following vaginal delivery 03/28/2021   Iron deficiency 02/18/2021   Marijuana use 08/25/2020   Supervision of other normal pregnancy, antepartum 08/13/2020    Past Surgical History:  Procedure Laterality Date   OPEN REDUCTION INTERNAL FIXATION (ORIF) TIBIA/FIBULA FRACTURE Right    age 21    OB History     Gravida  2   Para  2   Term  2   Preterm      AB      Living  2      SAB      IAB      Ectopic      Multiple  0   Live Births  1            Home Medications    Prior to Admission medications   Medication Sig Start Date End Date Taking? Authorizing Provider  dextromethorphan-guaiFENesin (MUCINEX DM) 30-600 MG 12hr tablet Take 1 tablet by mouth 2 (two) times daily. 05/18/21  Yes Hazel Sams, PA-C  triamcinolone (NASACORT) 55 MCG/ACT AERO nasal inhaler Place 2 sprays into the nose daily. 05/18/21  Yes Hazel Sams, PA-C    Family History Family History  Problem Relation Age of Onset   Hypertension Mother    Diabetes Maternal Uncle    Diabetes Other     Social History Social History   Tobacco Use   Smoking status: Never   Smokeless tobacco: Never  Vaping Use   Vaping Use: Never used  Substance Use Topics   Alcohol use: Never   Drug use: Yes    Types: Marijuana     Allergies    Codeine   Review of Systems Review of Systems  Constitutional:  Negative for appetite change, chills and fever.  HENT:  Positive for sore throat. Negative for congestion, ear pain, rhinorrhea, sinus pressure and sinus pain.   Eyes:  Negative for redness and visual disturbance.  Respiratory:  Negative for cough, chest tightness, shortness of breath and wheezing.   Cardiovascular:  Negative for chest pain and palpitations.  Gastrointestinal:  Negative for abdominal pain, constipation, diarrhea, nausea and vomiting.  Genitourinary:  Negative for dysuria, frequency and urgency.  Musculoskeletal:  Negative for myalgias.  Neurological:  Negative for dizziness, weakness and headaches.  Psychiatric/Behavioral:  Negative for confusion.   All other systems reviewed and are negative.   Physical Exam Triage Vital Signs ED Triage Vitals  Enc Vitals Group     BP 05/18/21 1008 123/78     Pulse Rate 05/18/21 1008 83     Resp 05/18/21 1008 18     Temp 05/18/21 1008 98.8 F (37.1 C)     Temp Source 05/18/21 1008 Oral     SpO2 05/18/21 1008 100 %     Weight --  Height --      Head Circumference --      Peak Flow --      Pain Score 05/18/21 1009 8     Pain Loc --      Pain Edu? --      Excl. in [place]? --    No data found.  Updated Vital Signs BP 123/78 (BP Location: Left Arm)    Pulse 83    Temp 98.8 F (37.1 C) (Oral)    Resp 18    SpO2 100%    Breastfeeding No   Visual Acuity Right Eye Distance:   Left Eye Distance:   Bilateral Distance:    Right Eye Near:   Left Eye Near:    Bilateral Near:     Physical Exam Vitals reviewed.  Constitutional:      General: She is not in acute distress.    Appearance: Normal appearance. She is not ill-appearing.  HENT:     Head: Normocephalic and atraumatic.     Right Ear: Tympanic membrane, ear canal and external ear normal. No tenderness. No middle ear effusion. There is no impacted cerumen. Tympanic membrane is not perforated,  erythematous, retracted or bulging.     Left Ear: Tympanic membrane, ear canal and external ear normal. No tenderness.  No middle ear effusion. There is no impacted cerumen. Tympanic membrane is not perforated, erythematous, retracted or bulging.     Nose: Nose normal. No congestion.     Mouth/Throat:     Mouth: Mucous membranes are moist.     Pharynx: Uvula midline. No oropharyngeal exudate or posterior oropharyngeal erythema.     Tonsils: No tonsillar exudate.     Comments: Minimal posterior pharyngeal erythema, tonsils barely visible. On exam, uvula is midline, she is tolerating her secretions without difficulty, there is no trismus, no drooling, she has normal phonation  Eyes:     Extraocular Movements: Extraocular movements intact.     Pupils: Pupils are equal, round, and reactive to light.  Cardiovascular:     Rate and Rhythm: Normal rate and regular rhythm.     Heart sounds: Normal heart sounds.  Pulmonary:     Effort: Pulmonary effort is normal.     Breath sounds: Normal breath sounds. No decreased breath sounds, wheezing, rhonchi or rales.  Abdominal:     Palpations: Abdomen is soft.     Tenderness: There is no abdominal tenderness. There is no guarding or rebound.  Lymphadenopathy:     Cervical: No cervical adenopathy.     Right cervical: No superficial cervical adenopathy.    Left cervical: No superficial cervical adenopathy.  Neurological:     General: No focal deficit present.     Mental Status: She is alert and oriented to person, place, and time.  Psychiatric:        Mood and Affect: Mood normal.        Behavior: Behavior normal.        Thought Content: Thought content normal.        Judgment: Judgment normal.     UC Treatments / Results  Labs (all labs ordered are listed, but only abnormal results are displayed) Labs Reviewed - No data to display  EKG   Radiology No results found.  Procedures Procedures (including critical care time)  Medications  Ordered in UC Medications - No data to display  Initial Impression / Assessment and Plan / UC Course  I have reviewed the triage vital signs and the nursing notes.  Pertinent labs & imaging results that were available during my care of the patient were reviewed by me and considered in my medical decision making (see chart for details).     This patient is a very pleasant 26 y.o. year old female presenting with viral pharyngitis. Afebrile, nontachy.   Centor score 0, did not check a rapid strep, she is in agreement.   Appears to have mild viral syndrome. Start mucinex and nasacort.   ED return precautions discussed. Patient verbalizes understanding and agreement.    Final Clinical Impressions(s) / UC Diagnoses   Final diagnoses:  Viral pharyngitis  Fluid level behind tympanic membrane of both ears     Discharge Instructions      -Nasacort nasal spray once daily while symptoms persist -Mucinex DM up to twice daily, this is over-the-counter but I also sent a prescription -With a virus, you're typically contagious for 5-7 days, or as long as you're having fevers.       ED Prescriptions     Medication Sig Dispense Auth. Provider   triamcinolone (NASACORT) 55 MCG/ACT AERO nasal inhaler Place 2 sprays into the nose daily. 1 each Hazel Sams, PA-C   dextromethorphan-guaiFENesin [facility] DM) 30-600 MG 12hr tablet Take 1 tablet by mouth 2 (two) times daily. 20 tablet Hazel Sams, PA-C      PDMP not reviewed this encounter.   Hazel Sams, PA-C 05/18/21 1032

## 2021-05-18 NOTE — ED Triage Notes (Signed)
Pt c/o sore throat x3 days, bilateral ear pain x2 days. States took ibuprofen with no relief.

## 2021-05-19 ENCOUNTER — Inpatient Hospital Stay (HOSPITAL_BASED_OUTPATIENT_CLINIC_OR_DEPARTMENT_OTHER): Payer: Medicaid Other | Admitting: Internal Medicine

## 2021-05-19 ENCOUNTER — Inpatient Hospital Stay: Payer: Medicaid Other | Attending: Internal Medicine

## 2021-05-19 ENCOUNTER — Inpatient Hospital Stay: Payer: Medicaid Other

## 2021-05-19 ENCOUNTER — Encounter: Payer: Self-pay | Admitting: Internal Medicine

## 2021-05-19 DIAGNOSIS — D509 Iron deficiency anemia, unspecified: Secondary | ICD-10-CM | POA: Diagnosis present

## 2021-05-19 DIAGNOSIS — E611 Iron deficiency: Secondary | ICD-10-CM

## 2021-05-19 DIAGNOSIS — O99013 Anemia complicating pregnancy, third trimester: Secondary | ICD-10-CM | POA: Insufficient documentation

## 2021-05-19 LAB — CBC WITH DIFFERENTIAL/PLATELET
Abs Immature Granulocytes: 0.01 10*3/uL (ref 0.00–0.07)
Basophils Absolute: 0 10*3/uL (ref 0.0–0.1)
Basophils Relative: 1 %
Eosinophils Absolute: 0.4 10*3/uL (ref 0.0–0.5)
Eosinophils Relative: 5 %
HCT: 37.7 % (ref 36.0–46.0)
Hemoglobin: 12.7 g/dL (ref 12.0–15.0)
Immature Granulocytes: 0 %
Lymphocytes Relative: 46 %
Lymphs Abs: 3.6 10*3/uL (ref 0.7–4.0)
MCH: 29.2 pg (ref 26.0–34.0)
MCHC: 33.7 g/dL (ref 30.0–36.0)
MCV: 86.7 fL (ref 80.0–100.0)
Monocytes Absolute: 0.7 10*3/uL (ref 0.1–1.0)
Monocytes Relative: 8 %
Neutro Abs: 3.2 10*3/uL (ref 1.7–7.7)
Neutrophils Relative %: 40 %
Platelets: 264 10*3/uL (ref 150–400)
RBC: 4.35 MIL/uL (ref 3.87–5.11)
RDW: 15 % (ref 11.5–15.5)
WBC: 8 10*3/uL (ref 4.0–10.5)
nRBC: 0 % (ref 0.0–0.2)

## 2021-05-19 NOTE — Assessment & Plan Note (Signed)
#   Anemia-September 2022 hemoglobin 9 iron deficiency- ferritin: 4.  Likely secondary to pregnancy/third trimester-s/p Venofer.  Jan 2023-post delivery hemoglobin is 12.  Patient asymptomatic.  Hold any iron infusions.   # DISPOSITION: # No venofer today # follow up as planned- Dr.B

## 2021-05-19 NOTE — Progress Notes (Signed)
[facility] CONSULT NOTE  Patient Care Team: [facility], [place] as PCP - General  CHIEF COMPLAINTS/PURPOSE OF CONSULTATION: ANEMIA   HEMATOLOGY HISTORY:  # ANEMIA iron deficiency-pregnancy ; EDD dec 17th, 2022  Results for Meredith, Hubbard (MRN [id number]) as of 02/18/2021 11:18  Ref. Range 02/10/2021 08:48  Iron Latest Ref Range: 27 - 159 ug/dL 37  UIBC Latest Ref Range: 131 - 425 ug/dL 025 (H)  TIBC Latest Ref Range: 250 - 450 ug/dL 852 (H)  Ferritin Latest Ref Range: 15 - 150 ng/mL 9 (L)  Iron Saturation Latest Ref Range: 15 - 55 % 7 (LL)  Folate Latest Ref Range: >3.0 ng/mL 12.3    EDD-Dec 3rd, 2022 [third trimester]  HISTORY OF PRESENTING ILLNESS:  Meredith Hubbard 26 y.o.  female iron deficient anemia secondary to blood trimester/pregnancy is here for follow-up.  Patient had a vaginal delivery in December 2022-uneventful.  Patient is currently back at baseline health.  Review of Systems  Constitutional:  Negative for chills, diaphoresis, fever and weight loss.  HENT:  Negative for nosebleeds and sore throat.   Eyes:  Negative for double vision.  Respiratory:  Negative for cough, hemoptysis, sputum production and wheezing.   Cardiovascular:  Negative for chest pain, palpitations, orthopnea and leg swelling.  Gastrointestinal:  Negative for abdominal pain, blood in stool, constipation, diarrhea, heartburn, melena, nausea and vomiting.  Genitourinary:  Negative for dysuria, frequency and urgency.  Musculoskeletal:  Negative for back pain and joint pain.  Skin: Negative.  Negative for itching and rash.  Neurological:  Negative for tingling, focal weakness, weakness and headaches.  Endo/Heme/Allergies:  Does not bruise/bleed easily.  Psychiatric/Behavioral:  Negative for depression. The patient is not nervous/anxious and does not have insomnia.    MEDICAL HISTORY:  Past Medical History:  Diagnosis Date   Herpes    HSV-2 infection    Normal spontaneous  vaginal delivery 03/28/2021    SURGICAL HISTORY: Past Surgical History:  Procedure Laterality Date   OPEN REDUCTION INTERNAL FIXATION (ORIF) TIBIA/FIBULA FRACTURE Right    age 86    SOCIAL HISTORY: Social History   Socioeconomic History   Marital status: Married    Spouse name: Feliz Beam   Number of children: Not on file   Years of education: Not on file   Highest education level: Not on file  Occupational History   Not on file  Tobacco Use   Smoking status: Never   Smokeless tobacco: Never  Vaping Use   Vaping Use: Never used  Substance and Sexual Activity   Alcohol use: Never   Drug use: Yes    Types: Marijuana   Sexual activity: Yes  Other Topics Concern   Not on file  Social History Narrative   Not on file   Social Determinants of Health   Financial Resource Strain: Not on file  Food Insecurity: Not on file  Transportation Needs: Not on file  Physical Activity: Not on file  Stress: Not on file  Social Connections: Not on file  Intimate Partner Violence: Not on file    FAMILY HISTORY: Family History  Problem Relation Age of Onset   Hypertension Mother    Diabetes Maternal Uncle    Diabetes Other     ALLERGIES:  is allergic to codeine.  MEDICATIONS:  Current Outpatient Medications  Medication Sig Dispense Refill   dextromethorphan-guaiFENesin (MUCINEX DM) 30-600 MG 12hr tablet Take 1 tablet by mouth 2 (two) times daily. 20 tablet 0   triamcinolone (NASACORT) 55  MCG/ACT AERO nasal inhaler Place 2 sprays into the nose daily. 1 each 1   No current facility-administered medications for this visit.      PHYSICAL EXAMINATION:   Vitals:   05/19/21 1101  BP: 125/75  Pulse: 95  Temp: 97.9 F (36.6 C)  SpO2: 98%   Filed Weights   05/19/21 1101  Weight: 196 lb 9.6 oz (89.2 kg)   Gravid uterus.  Physical Exam Vitals and nursing note reviewed.  HENT:     Head: Normocephalic and atraumatic.     Mouth/Throat:     Pharynx: Oropharynx is clear.   Eyes:     Extraocular Movements: Extraocular movements intact.     Pupils: Pupils are equal, round, and reactive to light.  Cardiovascular:     Rate and Rhythm: Normal rate and regular rhythm.  Pulmonary:     Comments: Decreased breath sounds bilaterally.  Abdominal:     Palpations: Abdomen is soft.  Musculoskeletal:        General: Normal range of motion.     Cervical back: Normal range of motion.  Skin:    General: Skin is warm.  Neurological:     General: No focal deficit present.     Mental Status: She is alert and oriented to person, place, and time.  Psychiatric:        Behavior: Behavior normal.        Judgment: Judgment normal.    LABORATORY DATA:  I have reviewed the data as listed Lab Results  Component Value Date   WBC 8.0 05/19/2021   HGB 12.7 05/19/2021   HCT 37.7 05/19/2021   MCV 86.7 05/19/2021   PLT 264 05/19/2021   Recent Labs    08/10/20 1642  NA 133*  K 3.8  CL 102  CO2 22  GLUCOSE 70  BUN 10  CREATININE 0.65  CALCIUM 9.0  GFRNONAA >60     No results found.  Iron deficiency # Anemia-September 2022 hemoglobin 9 iron deficiency- ferritin: 4.  Likely secondary to pregnancy/third trimester-s/p Venofer.  Jan 2023-post delivery hemoglobin is 12.  Patient asymptomatic.  Hold any iron infusions.   # DISPOSITION: # No venofer today # follow up as planned- Dr.B       All questions were answered. The patient knows to call the clinic with any problems, questions or concerns.      Earna Coder, MD 05/19/2021 11:23 AM

## 2021-05-20 ENCOUNTER — Ambulatory Visit: Payer: Medicaid Other | Admitting: Obstetrics

## 2021-05-24 ENCOUNTER — Other Ambulatory Visit: Payer: Self-pay

## 2021-05-24 ENCOUNTER — Encounter: Payer: Self-pay | Admitting: Obstetrics

## 2021-05-24 ENCOUNTER — Ambulatory Visit: Payer: Medicaid Other | Admitting: Obstetrics

## 2021-05-24 VITALS — BP 120/80 | Ht 61.0 in | Wt 197.0 lb

## 2021-05-24 DIAGNOSIS — Z3202 Encounter for pregnancy test, result negative: Secondary | ICD-10-CM | POA: Diagnosis not present

## 2021-05-24 DIAGNOSIS — Z975 Presence of (intrauterine) contraceptive device: Secondary | ICD-10-CM | POA: Insufficient documentation

## 2021-05-24 DIAGNOSIS — Z30430 Encounter for insertion of intrauterine contraceptive device: Secondary | ICD-10-CM | POA: Diagnosis not present

## 2021-05-24 LAB — POCT URINE PREGNANCY: Preg Test, Ur: NEGATIVE

## 2021-05-24 NOTE — Progress Notes (Signed)
I      Obstetrics & Gynecology Office Visit   Chief Complaint:  Chief Complaint  Patient presents with   Contraception    Subjective: Meredith Hubbard presents for an IUD placement. She reports her LMP was 05/19/2021. She had sex during her period.  She is 7 weeks postpartum following a NSVD.  She had had her 6 week PP visit several weeks ago and plans were made at that time for her to have a Mirena placed today.  She admitys to having unprotected sex since our last appointment. Her LMP date: 05/19/2021   Review of Systems:  Review of Systems  Constitutional: Negative.   HENT: Negative.    Eyes: Negative.   Cardiovascular: Negative.   Gastrointestinal: Negative.   Genitourinary: Negative.   Musculoskeletal: Negative.   Skin: Negative.   All other systems reviewed and are negative.   Past Medical History:  Past Medical History:  Diagnosis Date   Herpes    HSV-2 infection    Normal spontaneous vaginal delivery 03/28/2021    Past Surgical History:  Past Surgical History:  Procedure Laterality Date   OPEN REDUCTION INTERNAL FIXATION (ORIF) TIBIA/FIBULA FRACTURE Right    age 48    Gynecologic History: Patient's last menstrual period was 05/19/2021.  Obstetric History: [id number]  Family History:  Family History  Problem Relation Age of Onset   Hypertension Mother    Diabetes Maternal Uncle    Diabetes Other     Social History:  Social History   Socioeconomic History   Marital status: Married    Spouse name: Feliz Beam   Number of children: Not on file   Years of education: Not on file   Highest education level: Not on file  Occupational History   Not on file  Tobacco Use   Smoking status: Never   Smokeless tobacco: Never  Vaping Use   Vaping Use: Never used  Substance and Sexual Activity   Alcohol use: Never   Drug use: Yes    Types: Marijuana   Sexual activity: Yes  Other Topics Concern   Not on file  Social History Narrative   Not on file   Social  Determinants of Health   Financial Resource Strain: Not on file  Food Insecurity: Not on file  Transportation Needs: Not on file  Physical Activity: Not on file  Stress: Not on file  Social Connections: Not on file  Intimate Partner Violence: Not on file    Allergies:  Allergies  Allergen Reactions   Codeine     Medications: Prior to Admission medications   Medication Sig Start Date End Date Taking? Authorizing Provider  dextromethorphan-guaiFENesin (MUCINEX DM) 30-600 MG 12hr tablet Take 1 tablet by mouth 2 (two) times daily. Patient not taking: Reported on 05/24/2021 05/18/21   Rhys Martini, PA-C  triamcinolone (NASACORT) 55 MCG/ACT AERO nasal inhaler Place 2 sprays into the nose daily. Patient not taking: Reported on 05/24/2021 05/18/21   Rhys Martini, PA-C    Physical Exam Vitals:  Vitals:   05/24/21 0936  BP: 120/80   Patient's last menstrual period was 05/19/2021.  Physical Exam Constitutional:      Appearance: She is obese.  HENT:     Head: Normocephalic and atraumatic.  Cardiovascular:     Rate and Rhythm: Normal rate and regular rhythm.     Pulses: Normal pulses.     Heart sounds: Normal heart sounds.  Pulmonary:     Effort: Pulmonary effort is normal.  Breath sounds: Normal breath sounds.  Abdominal:     Palpations: Abdomen is soft.  Genitourinary:    General: Normal vulva.     Rectum: Normal.     Comments: Uterus is anteverted. Sounded to 7.5 cms. No abnormal discharge- scant menstrual bleeding. Skin:    General: Skin is warm and dry.  Neurological:     General: No focal deficit present.     Mental Status: She is alert and oriented to person, place, and time.  Psychiatric:        Mood and Affect: Mood normal.        Behavior: Behavior normal.     Assessment: 26 y.o. [id number]  for IUD placement   Plan: Problem List Items Addressed This Visit   None    UD Insertion Procedure Note Patient identified, informed consent performed,  consent signed.   Discussed risks of irregular bleeding, cramping, infection, malpositioning, expulsion or uterine perforation of the IUD (1:1000 placements)  which may require further procedure such as laparoscopy.  IUD while effective at preventing pregnancy do not prevent transmission of sexually transmitted diseases and use of barrier methods for this purpose was discussed. Time out was performed.  Urine pregnancy test negative.  Speculum placed in the vagina.  Cervix visualized.  Cleaned with Betadine x 2.  Grasped anteriorly with a single tooth tenaculum.  Uterus sounded to 7.5 cm. IUD placed per manufacturer's recommendations.  Strings trimmed to 3 cm. Tenaculum was removed, good hemostasis noted.  Patient tolerated procedure well.   Patient was given post-procedure instructions.  She was advised to have backup contraception for one week.  Patient was also asked to check IUD strings periodically and follow up in 4 weeks for IUD check.   F/U PRN for any problems or for a string check.  Mirna Mires, CNM  05/24/2021 10:07 AM

## 2021-08-05 ENCOUNTER — Encounter (HOSPITAL_COMMUNITY): Payer: Self-pay

## 2021-08-05 ENCOUNTER — Emergency Department (HOSPITAL_COMMUNITY)
Admission: EM | Admit: 2021-08-05 | Discharge: 2021-08-05 | Disposition: A | Discharge status: Home / Self Care | Payer: Medicaid Other | Attending: Emergency Medicine | Admitting: Emergency Medicine

## 2021-08-05 ENCOUNTER — Other Ambulatory Visit: Payer: Self-pay

## 2021-08-05 ENCOUNTER — Emergency Department (HOSPITAL_COMMUNITY): Payer: Medicaid Other

## 2021-08-05 DIAGNOSIS — R4182 Altered mental status, unspecified: Secondary | ICD-10-CM | POA: Diagnosis not present

## 2021-08-05 DIAGNOSIS — R5383 Other fatigue: Secondary | ICD-10-CM | POA: Insufficient documentation

## 2021-08-05 DIAGNOSIS — R059 Cough, unspecified: Secondary | ICD-10-CM | POA: Diagnosis not present

## 2021-08-05 DIAGNOSIS — R519 Headache, unspecified: Secondary | ICD-10-CM | POA: Diagnosis not present

## 2021-08-05 DIAGNOSIS — R509 Fever, unspecified: Secondary | ICD-10-CM | POA: Insufficient documentation

## 2021-08-05 DIAGNOSIS — Z20822 Contact with and (suspected) exposure to covid-19: Secondary | ICD-10-CM | POA: Diagnosis not present

## 2021-08-05 LAB — RESPIRATORY PANEL BY PCR

## 2021-08-05 LAB — RESP PANEL BY RT-PCR (FLU A&B, COVID) ARPGX2
Influenza A by PCR: NEGATIVE
Influenza B by PCR: NEGATIVE
SARS Coronavirus 2 by RT PCR: NEGATIVE

## 2021-08-05 LAB — COMPREHENSIVE METABOLIC PANEL
ALT: 26 U/L (ref 0–44)
AST: 25 U/L (ref 15–41)
Albumin: 3.8 g/dL (ref 3.5–5.0)
Alkaline Phosphatase: 95 U/L (ref 38–126)
Anion gap: 8 (ref 5–15)
BUN: 11 mg/dL (ref 6–20)
CO2: 22 mmol/L (ref 22–32)
Calcium: 8.9 mg/dL (ref 8.9–10.3)
Chloride: 107 mmol/L (ref 98–111)
Creatinine, Ser: 0.67 mg/dL (ref 0.44–1.00)
GFR, Estimated: >60 mL/min (ref >60)
Glucose, Bld: 100 mg/dL — ABNORMAL HIGH (ref 70–99)
Potassium: 3.7 mmol/L (ref 3.5–5.1)
Sodium: 137 mmol/L (ref 135–145)
Total Bilirubin: 0.6 mg/dL (ref 0.3–1.2)
Total Protein: 7.2 g/dL (ref 6.5–8.1)

## 2021-08-05 LAB — URINALYSIS, ROUTINE W REFLEX MICROSCOPIC
Bilirubin Urine: NEGATIVE
Glucose, UA: NEGATIVE mg/dL
Ketones, ur: NEGATIVE mg/dL
Nitrite: NEGATIVE
Protein, ur: NEGATIVE mg/dL
RBC / HPF: >50 RBC/hpf — ABNORMAL HIGH (ref 0–5)
Specific Gravity, Urine: 1.021 (ref 1.005–1.030)
pH: 7 (ref 5.0–8.0)

## 2021-08-05 LAB — LACTIC ACID, PLASMA: Lactic Acid, Venous: 2.2 mmol/L (ref 0.5–1.9)

## 2021-08-05 LAB — CBC WITH DIFFERENTIAL/PLATELET
Abs Immature Granulocytes: 0.11 10*3/uL — ABNORMAL HIGH (ref 0.00–0.07)
Basophils Absolute: 0.1 10*3/uL (ref 0.0–0.1)
Basophils Relative: 0 %
Eosinophils Absolute: 0.2 10*3/uL (ref 0.0–0.5)
Eosinophils Relative: 1 %
HCT: 38.8 % (ref 36.0–46.0)
Hemoglobin: 12.9 g/dL (ref 12.0–15.0)
Immature Granulocytes: 1 %
Lymphocytes Relative: 11 %
Lymphs Abs: 2.4 10*3/uL (ref 0.7–4.0)
MCH: 30.5 pg (ref 26.0–34.0)
MCHC: 33.2 g/dL (ref 30.0–36.0)
MCV: 91.7 fL (ref 80.0–100.0)
Monocytes Absolute: 1.1 10*3/uL — ABNORMAL HIGH (ref 0.1–1.0)
Monocytes Relative: 5 %
Neutro Abs: 17.1 10*3/uL — ABNORMAL HIGH (ref 1.7–7.7)
Neutrophils Relative %: 82 %
Platelets: 295 10*3/uL (ref 150–400)
RBC: 4.23 MIL/uL (ref 3.87–5.11)
RDW: 12.9 % (ref 11.5–15.5)
WBC: 21.1 10*3/uL — ABNORMAL HIGH (ref 4.0–10.5)
nRBC: 0 % (ref 0.0–0.2)

## 2021-08-05 LAB — I-STAT BETA HCG BLOOD, ED (MC, WL, AP ONLY): I-stat hCG, quantitative: <5 m[IU]/mL (ref <5)

## 2021-08-05 MED ORDER — ACETAMINOPHEN 650 MG RE SUPP
650.0000 mg | Freq: Once | RECTAL | Status: AC
Start: 2021-08-05 — End: 2021-08-05
  Administered 2021-08-05: 650 mg via RECTAL
  Filled 2021-08-05: qty 1

## 2021-08-05 MED ORDER — ACETAMINOPHEN 500 MG PO TABS
1000.0000 mg | ORAL_TABLET | Freq: Once | ORAL | Status: DC
  Filled 2021-08-05: qty 2

## 2021-08-05 MED ORDER — SODIUM CHLORIDE 0.9 % IV BOLUS
1000.0000 mL | Freq: Once | INTRAVENOUS | Status: AC
  Administered 2021-08-05: 1000 mL via INTRAVENOUS

## 2021-08-05 MED ORDER — KETOROLAC TROMETHAMINE 30 MG/ML IJ SOLN
30.0000 mg | Freq: Once | INTRAMUSCULAR | Status: AC
  Administered 2021-08-05: 30 mg via INTRAVENOUS
  Filled 2021-08-05: qty 1

## 2021-08-05 NOTE — Discharge Instructions (Addendum)
Continue tylenol or motrin as needed for fever. ?Make sure to rest and hydrate well at home. ?Monitor your symptoms closely-- if worsening, becoming short of breath, severe headache, neck stiffness, trouble turning head, etc you must come back to the ED immediately. ?

## 2021-08-05 NOTE — ED Provider Notes (Signed)
?[facility] EMERGENCY DEPARTMENT ?Provider Note ? ? ?CSN: [id number] ?Arrival date & time: 08/05/21  0128 ? ?  ? ?History ? ?Chief Complaint  ?Patient presents with  ? Fever  ? Altered Mental Status  ? ? ?Meredith Hubbard is a 26 y.o. female. ? ?The history is provided by the patient and medical records.  ?Fever ?Altered Mental Status ?Associated symptoms: fever   ? ?26 year old female presenting to the ED with fever and altered mental status.  Patient's husband found her lethargic and called EMS.  Once they arrived she would not answer questions.  She was found to have fever of 104F tympanic for EMS.  Per husband, she has not been feeling well and had a fever all day today but did not take any medications prior to arrival.  Patient denies any abdominal pain, nausea, vomiting, diarrhea.  Reports sore throat last week and some cough the past few days.  Denies hemoptysis.  She does report having a headache-- diffuse and throbbing in nature.  Denies neck pain/stiffness.  She was able to eat and drink today as normal.  She denies any sick contacts.  No meds PTA. ? ?Home Medications ?Prior to Admission medications   ?Medication Sig Start Date End Date Taking? Authorizing Provider  ?triamcinolone (NASACORT) 55 MCG/ACT AERO nasal inhaler Place 2 sprays into the nose daily. ?Patient not taking: Reported on 05/24/2021 05/18/21   Rhys Martini, PA-C  ?   ? ?Allergies    ?Codeine   ? ?Review of Systems   ?Review of Systems  ?Constitutional:  Positive for fever.  ?All other systems reviewed and are negative. ? ?Physical Exam ?Updated Vital Signs ?BP (!) 141/82   Pulse (!) 129   Temp (!) 102.5 ?F (39.2 ?C) (Oral)   Resp (!) 24   Ht 5\' 1"  (1.549 m)   Wt 95.8 kg   SpO2 100%   BMI 39.91 kg/m?  ? ?Physical Exam ?Vitals and nursing note reviewed.  ?Constitutional:   ?   Appearance: She is well-developed.  ?   Comments: Warm to touch, tearful  ?HENT:  ?   Head: Normocephalic and atraumatic.  ?Eyes:  ?    Conjunctiva/sclera: Conjunctivae normal.  ?   Pupils: Pupils are equal, round, and reactive to light.  ?Neck:  ?   Comments: No nuchal rigidity, full ROM without elicited pain ?Cardiovascular:  ?   Rate and Rhythm: Normal rate and regular rhythm.  ?   Heart sounds: Normal heart sounds.  ?Pulmonary:  ?   Effort: Pulmonary effort is normal.  ?   Breath sounds: Normal breath sounds.  ?Abdominal:  ?   General: Bowel sounds are normal.  ?   Palpations: Abdomen is soft.  ?   Comments: Soft, non-tender  ?Musculoskeletal:     ?   General: Normal range of motion.  ?   Cervical back: Normal range of motion.  ?Skin: ?   General: Skin is warm and dry.  ?Neurological:  ?   Mental Status: She is alert and oriented to person, place, and time.  ?   Comments: AAOx3, answering questions without difficulty, following commands, moving extremities equally, no focal deficits  ? ? ?ED Results / Procedures / Treatments   ?Labs ?(all labs ordered are listed, but only abnormal results are displayed) ?Labs Reviewed  ?CBC WITH DIFFERENTIAL/PLATELET - Abnormal; Notable for the following components:  ?    Result Value  ? WBC 21.1 (*)   ? Neutro Abs 17.1 (*)   ?  Monocytes Absolute 1.1 (*)   ? Abs Immature Granulocytes 0.11 (*)   ? All other components within normal limits  ?COMPREHENSIVE METABOLIC PANEL - Abnormal; Notable for the following components:  ? Glucose, Bld 100 (*)   ? All other components within normal limits  ?LACTIC ACID, PLASMA - Abnormal; Notable for the following components:  ? Lactic Acid, Venous 2.2 (*)   ? All other components within normal limits  ?URINALYSIS, ROUTINE W REFLEX MICROSCOPIC - Abnormal; Notable for the following components:  ? APPearance HAZY (*)   ? Hgb urine dipstick LARGE (*)   ? Leukocytes,Ua TRACE (*)   ? RBC / HPF >50 (*)   ? Bacteria, UA RARE (*)   ? All other components within normal limits  ?RESP PANEL BY RT-PCR (FLU A&B, COVID) [id number]  ?RESPIRATORY PANEL BY PCR  ?CULTURE, BLOOD (ROUTINE X 2)  ?CULTURE,  BLOOD (ROUTINE X 2)  ?URINE CULTURE  ?I-STAT BETA HCG BLOOD, ED ([facility], [facility], [facility] ONLY)  ? ? ?EKG ?None ? ?Radiology ?DG Chest Port 1 View ? ?Result Date: 08/05/2021 ?CLINICAL DATA:  Fever and sepsis EXAM: PORTABLE CHEST 1 VIEW COMPARISON:  None. FINDINGS: The heart size and mediastinal contours are within normal limits. Both lungs are clear. The visualized skeletal structures are unremarkable. IMPRESSION: No active disease. Electronically Signed   By: Jasmine PangKim  Fujinaga M.D.   On: 08/05/2021 01:56   ? ?Procedures ?Procedures  ? ? ?Medications Ordered in ED ?Medications  ?acetaminophen (TYLENOL) tablet 1,000 mg (1,000 mg Oral Not Given 08/05/21 0158)  ?sodium chloride 0.9 % bolus 1,000 mL (0 mLs Intravenous Stopped 08/05/21 0355)  ?acetaminophen (TYLENOL) suppository 650 mg (650 mg Rectal Given 08/05/21 0251)  ?sodium chloride 0.9 % bolus 1,000 mL (1,000 mLs Intravenous New Bag/Given 08/05/21 0400)  ?ketorolac (TORADOL) 30 MG/ML injection 30 mg (30 mg Intravenous Given 08/05/21 0400)  ? ? ?ED Course/ Medical Decision Making/ A&P ?  ?                        ?Medical Decision Making ?Amount and/or Complexity of Data Reviewed ?Labs: ordered. ?Radiology: ordered and independent interpretation performed. ?ECG/medicine tests: ordered and independent interpretation performed. ? ?Risk ?OTC drugs. ?Prescription drug management. ? ? ?26 year old female presenting to the ED with fever.  Found to be lethargic by husband at home and called EMS.  She was responsive with EMS but not answering questions.  When I entered room she was fully able to speak, answer questions appropriately, follow all commands without focal deficits.  She reports fever all day today, sore throat last week and a little cough for the past few days.  Denies any sick contacts.  Febrile here but nontoxic.  She is tachycardic but blood pressure is stable.  Will initiate sepsis work-up with labs, x-ray, cultures, RVP.  She is given IV fluids. ? ?Labs as above-- WBC count 21K,  lactate 2.2.  chemistry is reassuring.  RVP and covid/flu negative.  CXR is clear.  UA with blood and appears to be contaminated with squamous cells, rare bacteria.  Given hematuria, consider stone but no history of same, no flank or abdominal pain.  Patient does report continuous vaginal bleeding since having IUD placed a few months ago.  This may very well be source of her hematuria.  Blood and urine cultures pending. ? ?5:07 AM ?Patient reassessed--- she states she is feeling significantly better after IV fluids and Toradol.  Her VS have improved, she remains  AAOx3, talkative and even laughing a bit on reassessment.  We have discussed her test results thus far and fact that there is no clear explanation of fever thus far.  We have discussed possibility of obtaining LP to r/o meningitis, albeit clinically she is without nuchal rigidity or other signs of toxicity suggestive of meningitis.  After explaining risk vs benefits and giving her time to weigh options, she has decided against obtaining LP as she is feeling significantly better.  I do feel this is reasonable as her symptoms seem more viral in origin, however I have given her strict return precautions for any new or acute changes including stiff neck, worsening headache, uncontrolled fevers, etc.  She acknowledged understanding and is agreeable.   ? ?Shared encounter with attending physician, Dr. Pilar Plate, who personally evaluated patient and agrees with plan of care. ? ?Final Clinical Impression(s) / ED Diagnoses ?Final diagnoses:  ?Fever, unspecified fever cause  ? ? ?Rx / DC Orders ?ED Discharge Orders   ? ? None  ? ?  ? ? ?  ?Garlon Hatchet, PA-C ?08/05/21 0542 ? ?  ?Sabas Sous, MD ?08/05/21 1740 ? ?

## 2021-08-05 NOTE — ED Notes (Signed)
Report handed off to [employer] ?

## 2021-08-05 NOTE — ED Triage Notes (Signed)
Patient arrives from home via EMS due to AMS and fever today. Patient's husband called due to patient being unresponsive. When EMS got there patient would not respond to questions, same on arrival. Eyes open but patient not talking. Patient's temperature 104 tympanic for EMS. Patient's husband states patient has had fever all day but has not taken any medicine.  ?

## 2021-08-05 NOTE — ED Notes (Signed)
ED Provider at bedside. Patient now alert and talking, answering all questions appropriately. GCS 15. ?

## 2021-08-05 NOTE — ED Notes (Signed)
Lactic 2.2 per lab 

## 2021-08-06 LAB — URINE CULTURE: Culture: 60000 — AB

## 2021-08-10 LAB — CULTURE, BLOOD (ROUTINE X 2)
Culture: NO GROWTH
Culture: NO GROWTH
Special Requests: ADEQUATE
Special Requests: ADEQUATE

## 2021-08-18 NOTE — Progress Notes (Deleted)
    [facility]   No chief complaint on file.   HPI:      Ms. Meredith Hubbard is a 26 y.o. G2P2002 whose LMP was No LMP recorded., presents today for *** Mirena placed 05/24/21 8 wks PP   Patient Active Problem List   Diagnosis Date Noted   IUD (intrauterine device) in place 05/24/2021   Postpartum care following vaginal delivery 03/28/2021   Iron deficiency 02/18/2021   Marijuana use 08/25/2020   Supervision of other normal pregnancy, antepartum 08/13/2020    Past Surgical History:  Procedure Laterality Date   OPEN REDUCTION INTERNAL FIXATION (ORIF) TIBIA/FIBULA FRACTURE Right    age 57    Family History  Problem Relation Age of Onset   Hypertension Mother    Diabetes Maternal Uncle    Diabetes Other     Social History   Socioeconomic History   Marital status: Married    Spouse name: Feliz Beam   Number of children: Not on file   Years of education: Not on file   Highest education level: Not on file  Occupational History   Not on file  Tobacco Use   Smoking status: Never   Smokeless tobacco: Never  Vaping Use   Vaping Use: Never used  Substance and Sexual Activity   Alcohol use: Never   Drug use: Yes    Types: Marijuana   Sexual activity: Yes  Other Topics Concern   Not on file  Social History Narrative   Not on file   Social Determinants of Health   Financial Resource Strain: Not on file  Food Insecurity: Not on file  Transportation Needs: Not on file  Physical Activity: Not on file  Stress: Not on file  Social Connections: Not on file  Intimate Partner Violence: Not on file    Outpatient Medications Prior to Visit  Medication Sig Dispense Refill   diphenhydrAMINE HCl (ALLERGY MED PO) Take 1 tablet by mouth daily as needed (allergies).     Doxylamine-Phenylephrine-APAP (SINUS & CONGESTION DAY/NIGHT PO) Take 2 tablets by mouth 2 (two) times daily as needed (congestion).     triamcinolone (NASACORT) 55 MCG/ACT AERO nasal inhaler Place 2  sprays into the nose daily. (Patient not taking: Reported on 05/24/2021) 1 each 1   No facility-administered medications prior to visit.      ROS:  Review of Systems BREAST: No symptoms   OBJECTIVE:   Vitals:  There were no vitals taken for this visit.  Physical Exam  Results: No results found for this or any previous visit (from the past 24 hour(s)).   Assessment/Plan: No diagnosis found.    No orders of the defined types were placed in this encounter.     No follow-ups on file.  Daila Elbert B. Mohammad Granade, PA-C 08/18/2021 4:36 PM

## 2021-08-19 ENCOUNTER — Ambulatory Visit: Payer: Medicaid Other | Admitting: Obstetrics and Gynecology

## 2021-08-23 IMAGING — US US OB COMP LESS 14 WK
1 series · 14 of 28 positions shown · non-contrast
Comparison: None

CLINICAL DATA: Vaginal bleeding in first trimester of pregnancy,
bleeding since this morning, LMP 05/16/2020, quantitative beta HCG =
109,144

EXAM:
OBSTETRIC <14 WK US AND TRANSVAGINAL OB US
TECHNIQUE: Both transabdominal and transvaginal ultrasound examinations were
performed for complete evaluation of the gestation as well as the
maternal uterus, adnexal regions, and pelvic cul-de-sac.
Transvaginal technique was performed to assess early pregnancy.

[Series 1: us ob comp less 14 wks · 83 acquisitions, 14 frames shown]
[im 4/83]
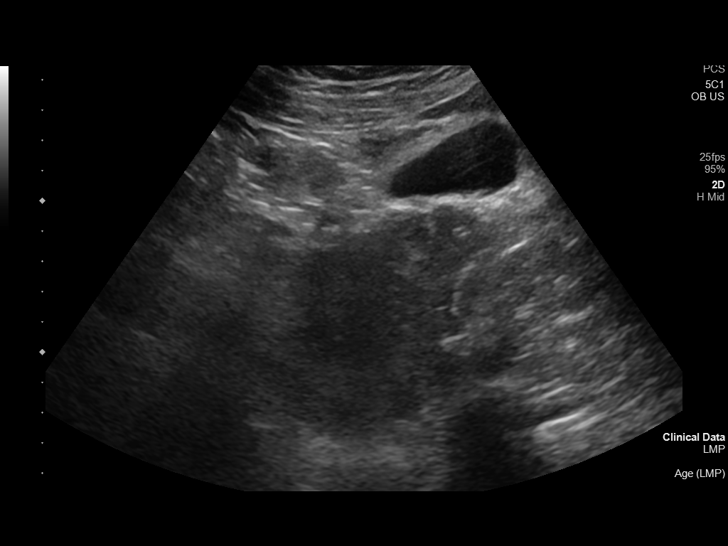
[im 10/83]
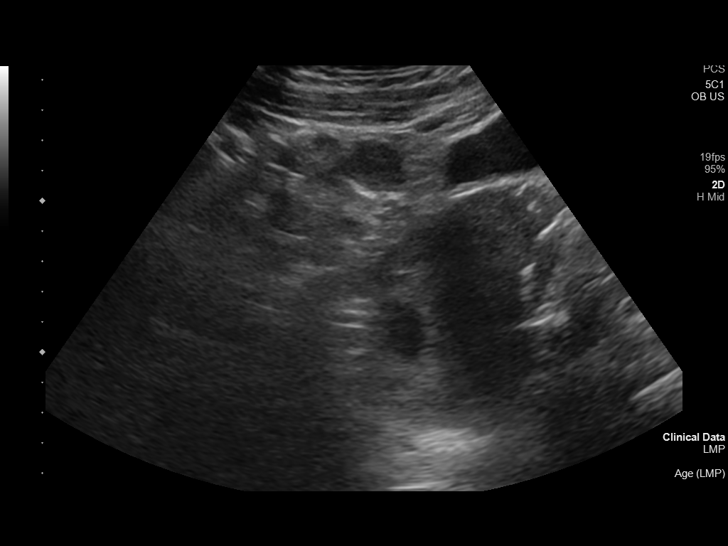
[im 16/83]
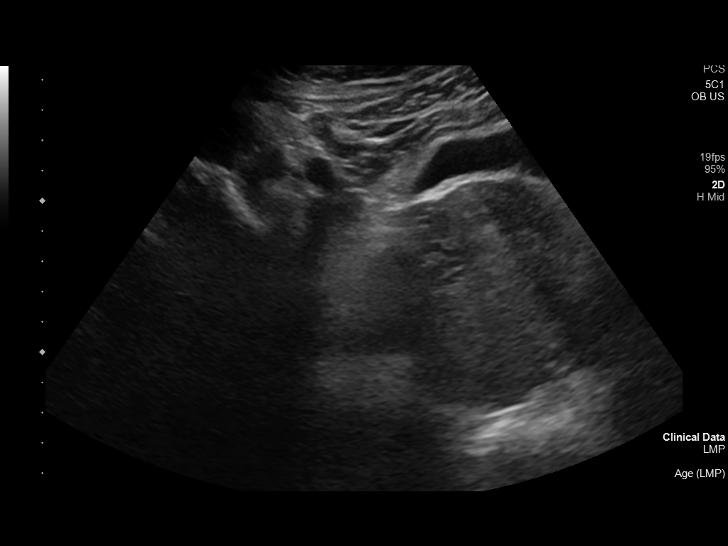
[im 22/83]
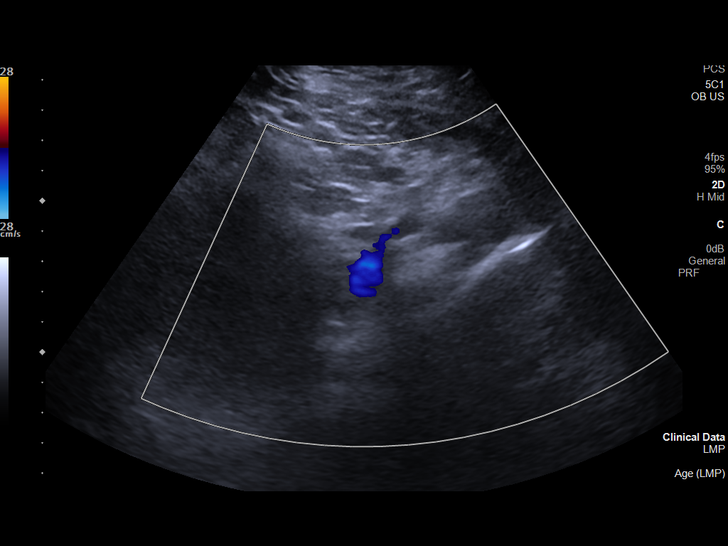
[im 28/83]
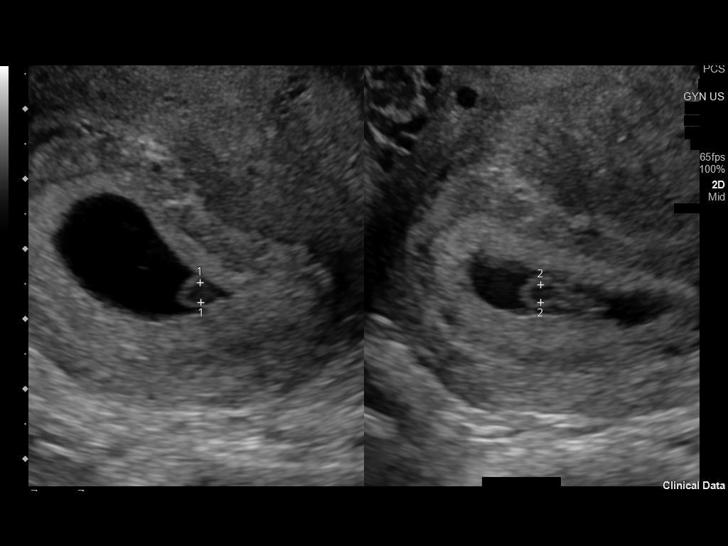
[im 34/83]
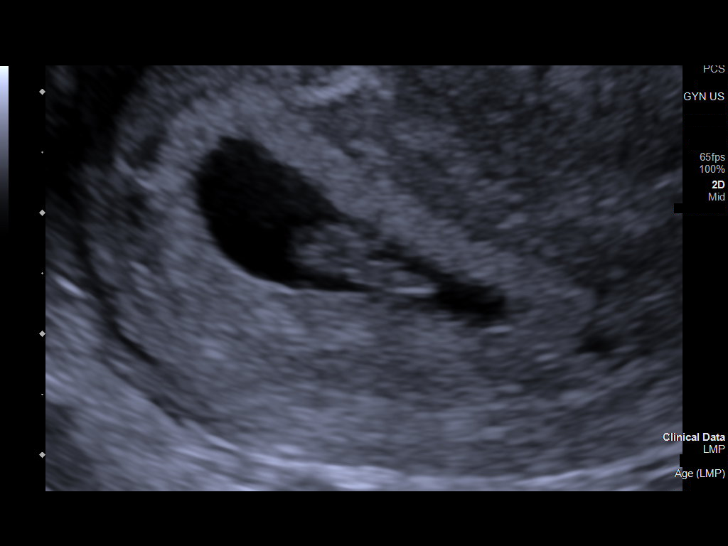
[im 40/83]
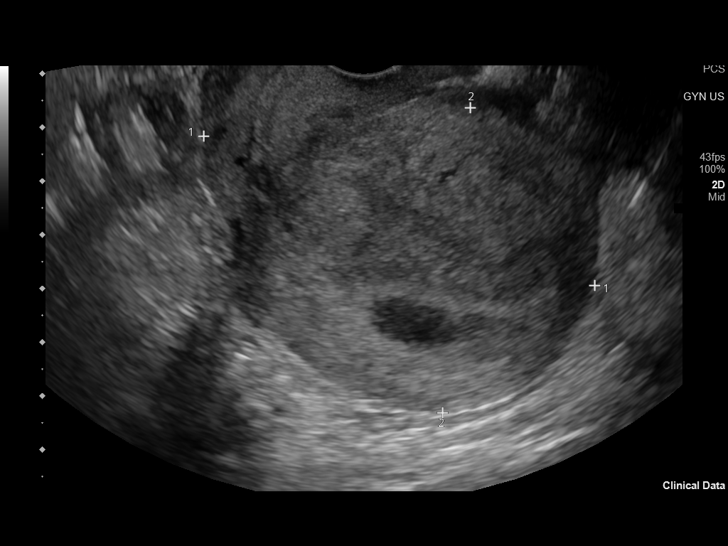
[im 46/83]
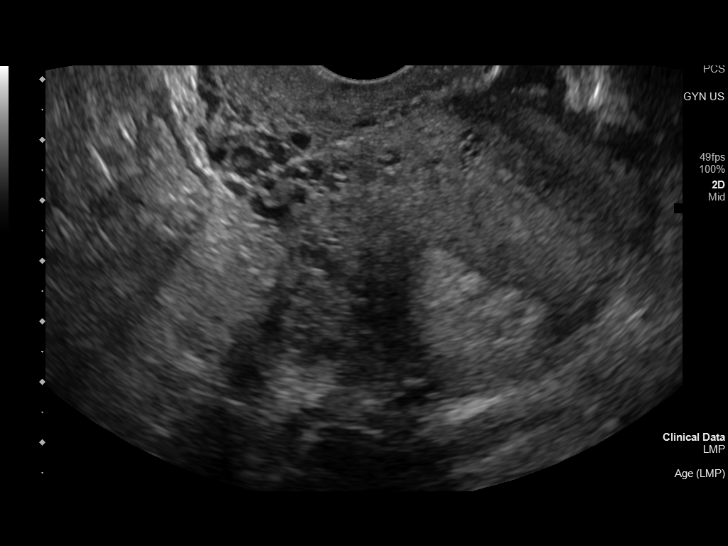
[im 52/83]
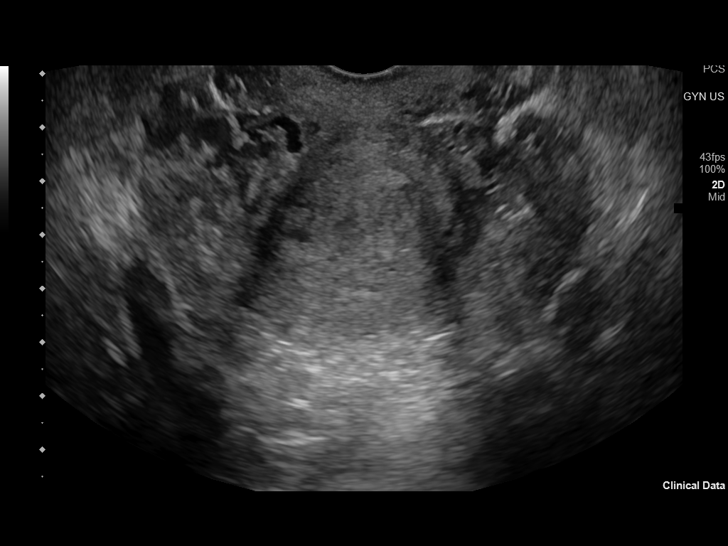
[im 58/83]
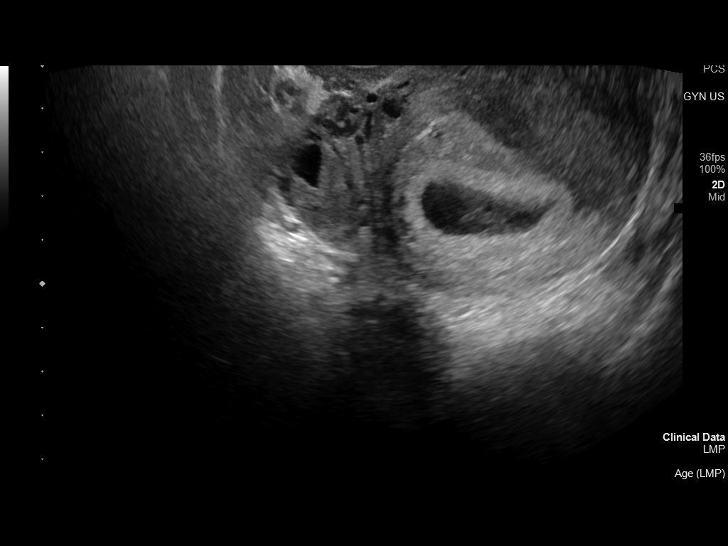
[im 64/83]
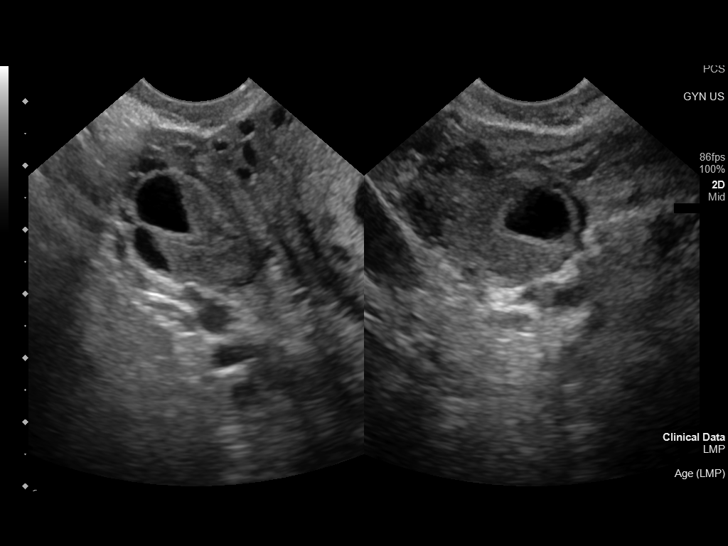
[im 70/83]
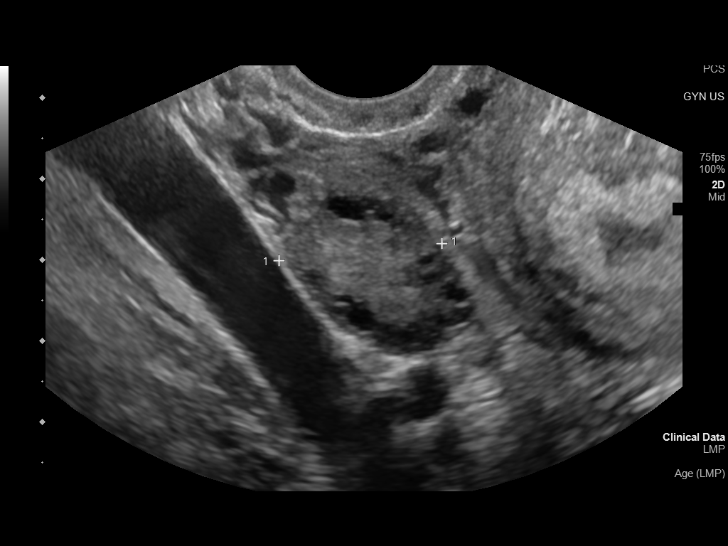
[im 76/83]
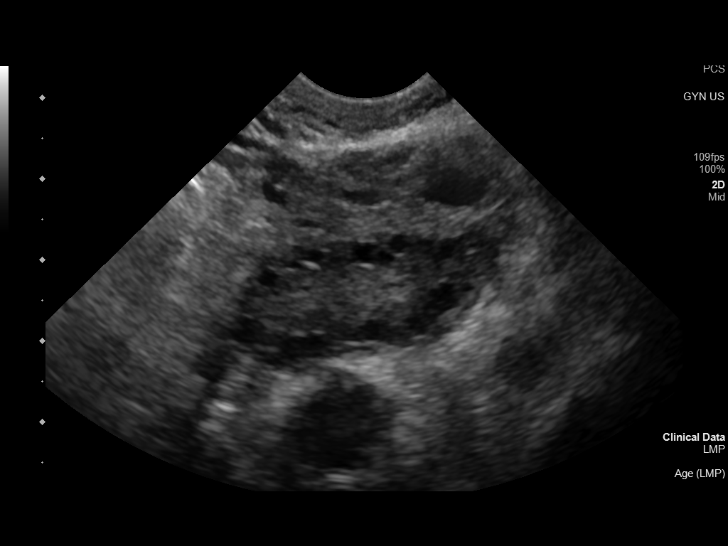
[im 83/83]
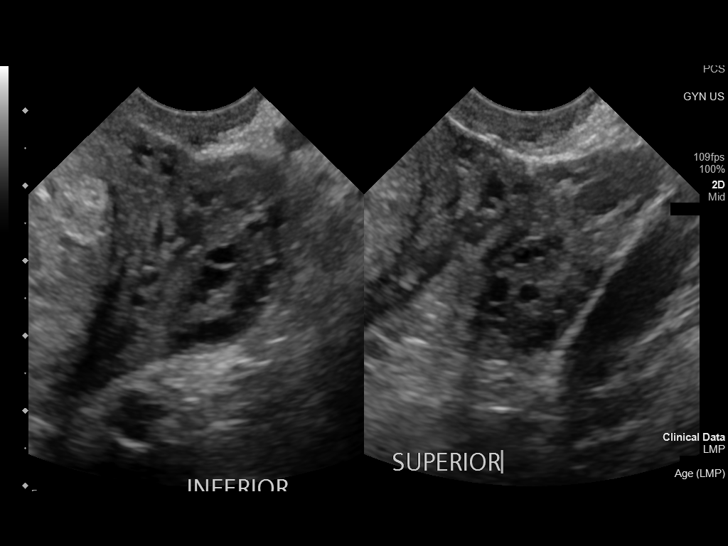

[14 of 28 positions shown; findings below may reference images not displayed]

FINDINGS: Intrauterine gestational sac: Present, single

Yolk sac:  Present

Embryo:  Present

Cardiac Activity: Present

Heart Rate: 126 bpm

CRL:  7.9 mm   6 w   5 d                  US EDC: 03/31/2021

Subchorionic hemorrhage:  None visualized.

Maternal uterus/adnexae:

Uterus retroverted, otherwise unremarkable

RIGHT ovary normal size and morphology 3.4 x 1.9 x 2.0 cm.

LEFT ovary normal size and morphology 3.4 x 1.5 x 1.5 cm.

No free pelvic fluid.  No adnexal masses.
IMPRESSION: Single live intrauterine gestation at 6 weeks 5 days EGA by
crown-rump length.

No acute abnormalities.

## 2021-08-24 ENCOUNTER — Ambulatory Visit: Payer: Medicaid Other | Admitting: Obstetrics and Gynecology

## 2021-08-24 NOTE — Progress Notes (Deleted)
    [facility]   No chief complaint on file.   HPI:      Ms. Meredith Hubbard is a 26 y.o. G2P2002 whose LMP was No LMP recorded., presents today for *** Mirena placed 05/24/21 8 wks PP   Patient Active Problem List   Diagnosis Date Noted   IUD (intrauterine device) in place 05/24/2021   Postpartum care following vaginal delivery 03/28/2021   Iron deficiency 02/18/2021   Marijuana use 08/25/2020   Supervision of other normal pregnancy, antepartum 08/13/2020    Past Surgical History:  Procedure Laterality Date   OPEN REDUCTION INTERNAL FIXATION (ORIF) TIBIA/FIBULA FRACTURE Right    age 23    Family History  Problem Relation Age of Onset   Hypertension Mother    Diabetes Maternal Uncle    Diabetes Other     Social History   Socioeconomic History   Marital status: Married    Spouse name: Meredith Hubbard   Number of children: Not on file   Years of education: Not on file   Highest education level: Not on file  Occupational History   Not on file  Tobacco Use   Smoking status: Never   Smokeless tobacco: Never  Vaping Use   Vaping Use: Never used  Substance and Sexual Activity   Alcohol use: Never   Drug use: Yes    Types: Marijuana   Sexual activity: Yes  Other Topics Concern   Not on file  Social History Narrative   Not on file   Social Determinants of Health   Financial Resource Strain: Not on file  Food Insecurity: Not on file  Transportation Needs: Not on file  Physical Activity: Not on file  Stress: Not on file  Social Connections: Not on file  Intimate Partner Violence: Not on file    Outpatient Medications Prior to Visit  Medication Sig Dispense Refill   diphenhydrAMINE HCl (ALLERGY MED PO) Take 1 tablet by mouth daily as needed (allergies).     Doxylamine-Phenylephrine-APAP (SINUS & CONGESTION DAY/NIGHT PO) Take 2 tablets by mouth 2 (two) times daily as needed (congestion).     triamcinolone (NASACORT) 55 MCG/ACT AERO nasal inhaler Place 2  sprays into the nose daily. (Patient not taking: Reported on 05/24/2021) 1 each 1   No facility-administered medications prior to visit.      ROS:  Review of Systems BREAST: No symptoms   OBJECTIVE:   Vitals:  There were no vitals taken for this visit.  Physical Exam  Results: No results found for this or any previous visit (from the past 24 hour(s)).   Assessment/Plan: No diagnosis found.    No orders of the defined types were placed in this encounter.     No follow-ups on file.  Marykay Mccleod B. Lavere Shinsky, PA-C 08/24/2021 11:55 AM

## 2021-11-17 IMAGING — US US OB COMP +14 WK
1 series · 13 of 28 positions shown · non-contrast
Comparison: none

CLINICAL DATA: Pregnancy.  Assess fetal anatomy.

EXAM:
OBSTETRICAL ULTRASOUND >14 WKS

[Series 1: us ob comp +14 wk · 0.17mm/px · 13 of 65 slices shown]
[im 3/65]
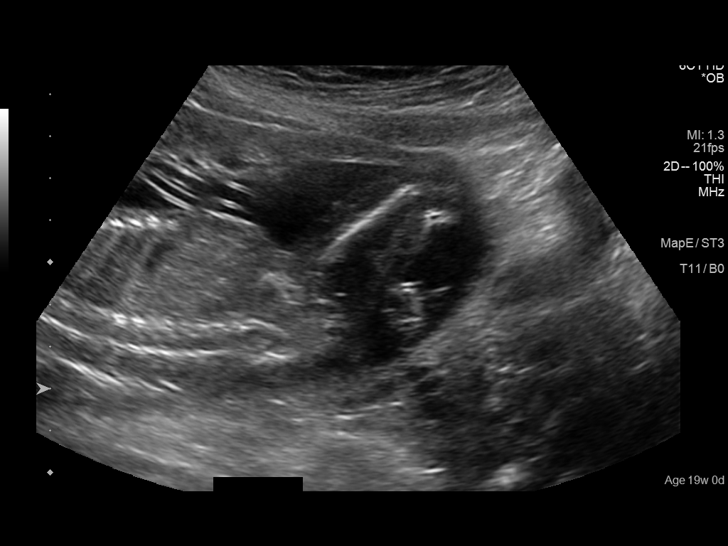
[im 8/65]
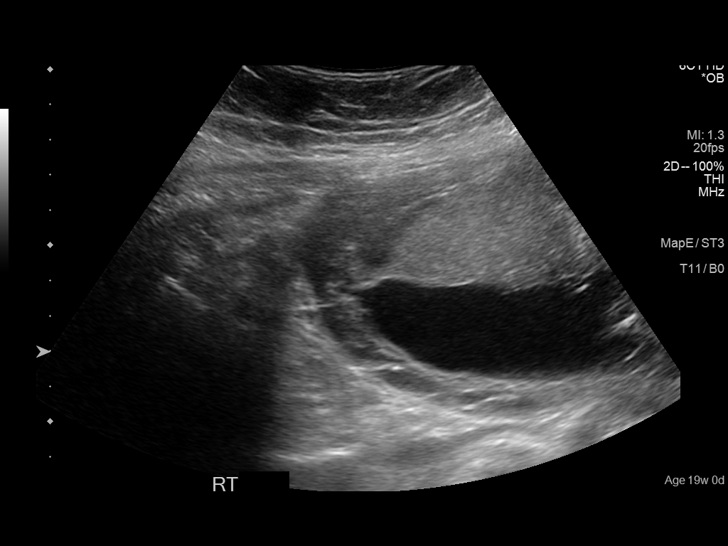
[im 12/65]
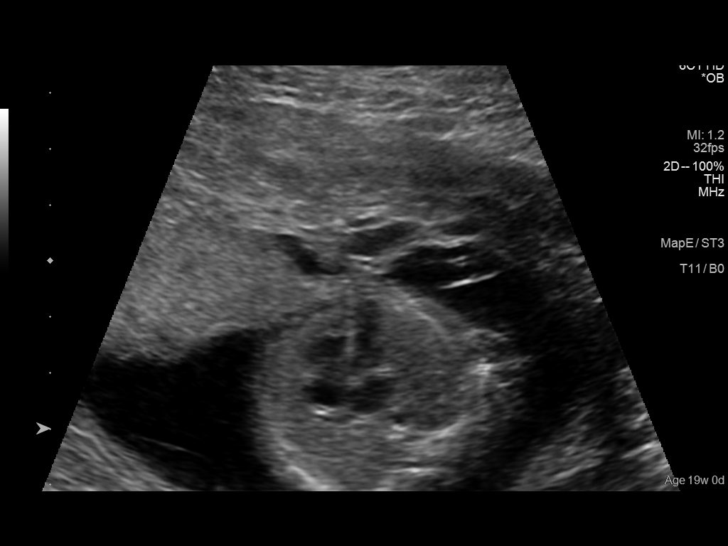
[im 17/65]
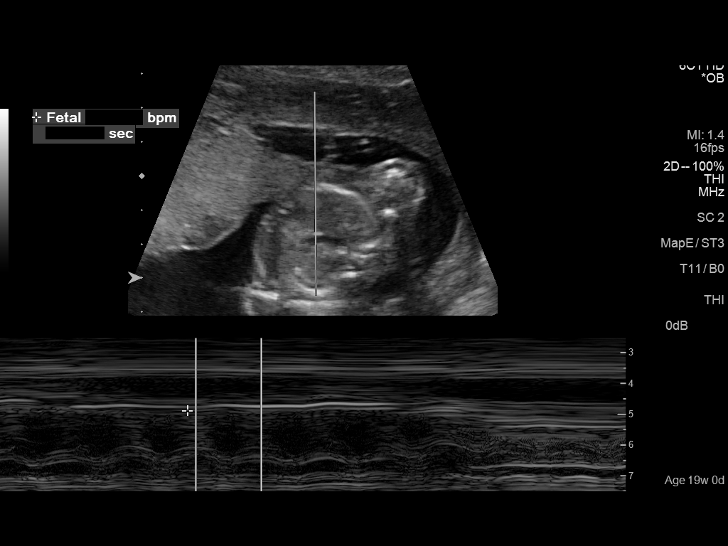
[im 22/65]
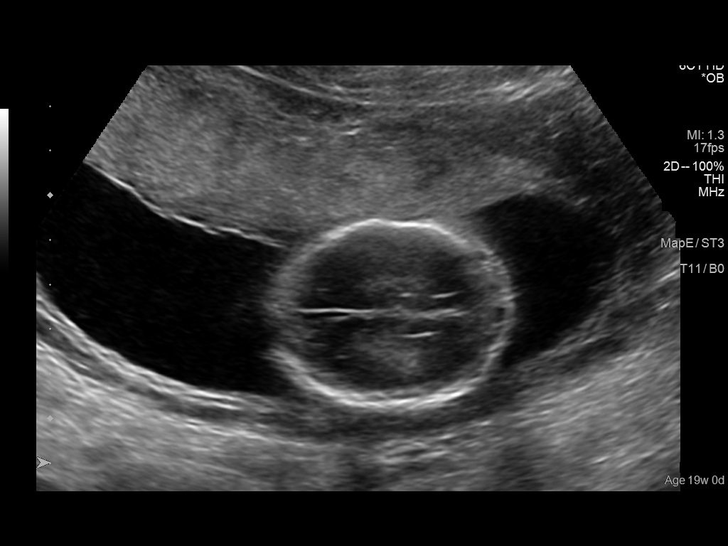
[im 27/65]
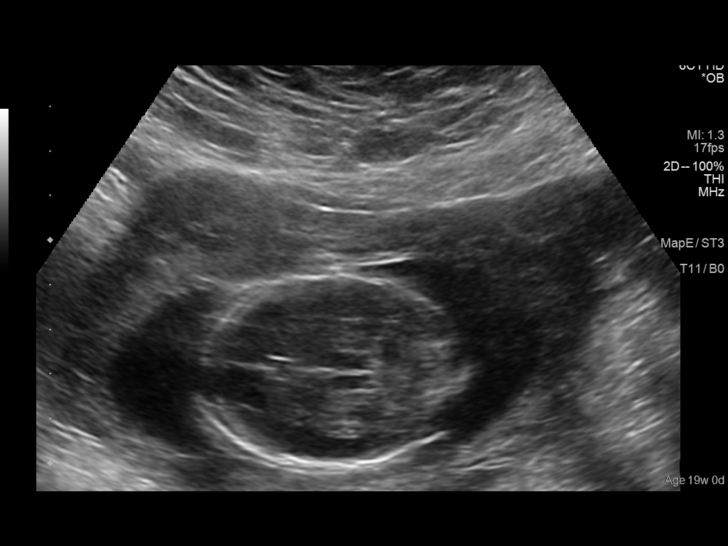
[im 34/65]
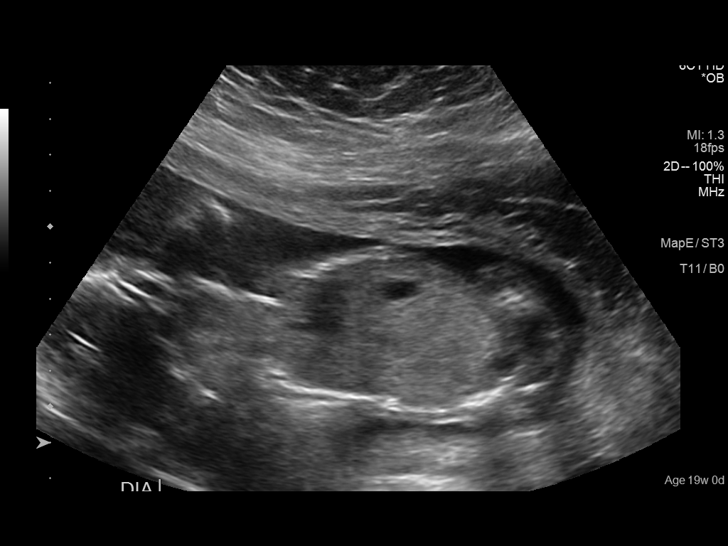
[im 38/65]
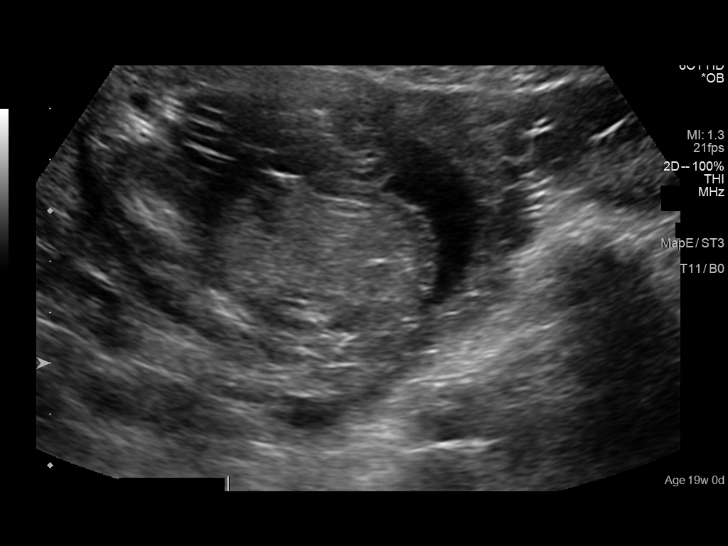
[im 43/65]
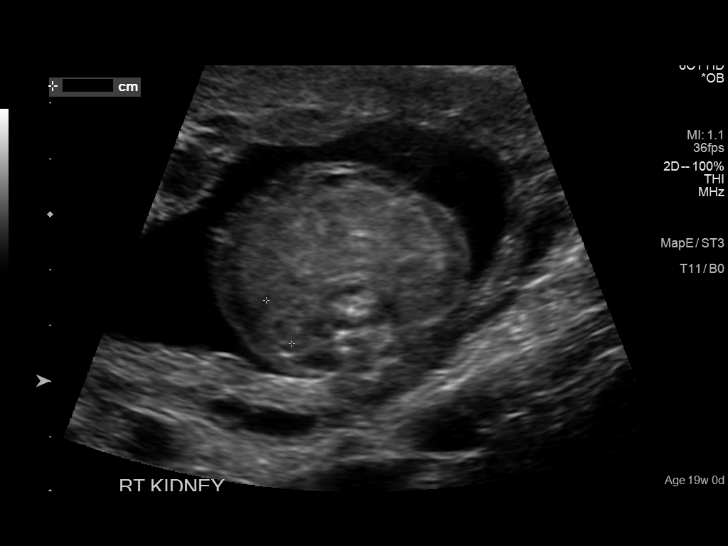
[im 48/65]
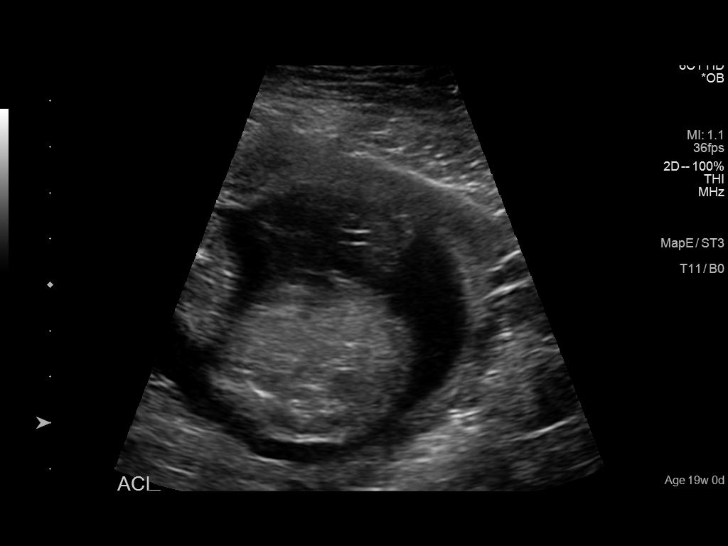
[im 53/65]
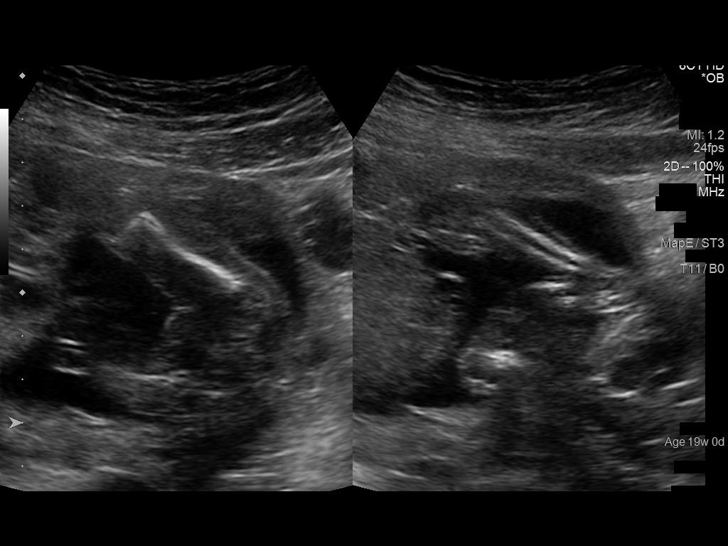
[im 57/65]
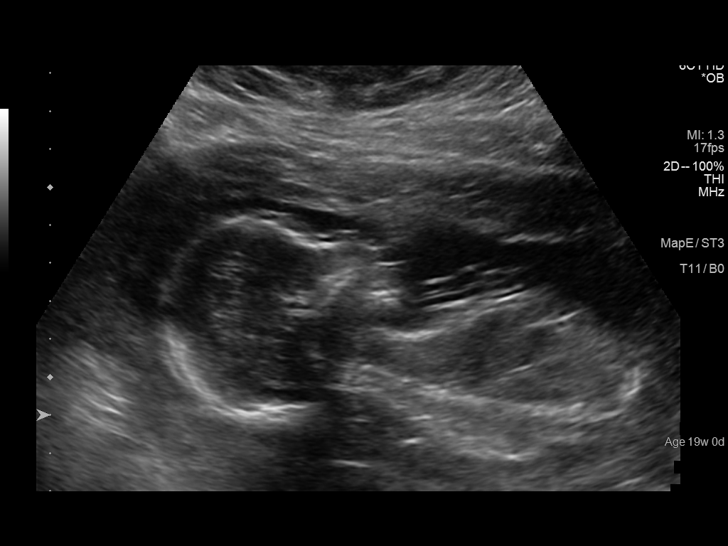
[im 62/65]
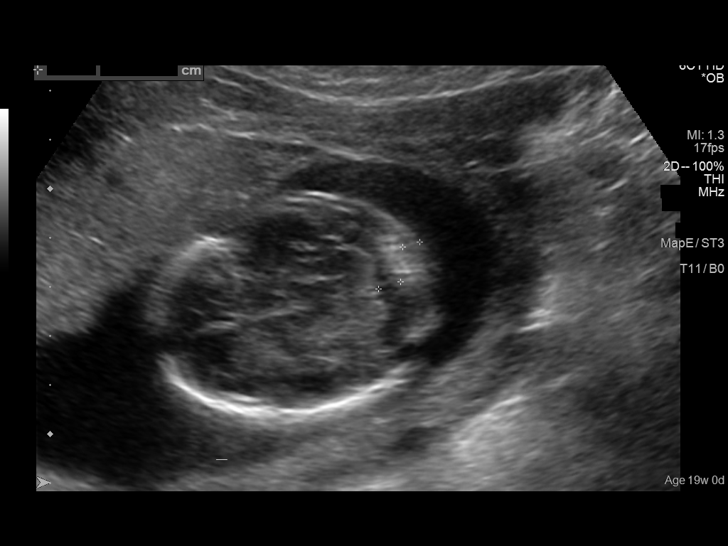

[13 of 28 positions shown; findings below may reference images not displayed]

FINDINGS: Number of Fetuses: 1

Heart Rate:  157 bpm

Movement: Yes

Presentation: Breech

Previa: No

Placental Location: Anterior

Amniotic Fluid (Subjective): Normal

Amniotic Fluid (Objective):

Vertical pocket = 3.6cm

FETAL BIOMETRY

BPD: 4.4cm 19w 1d

HC:   16.5cm 19w 2d

AC:   13.9cm 19w 2d

FL:   3.1cm 19w 4d

Current Mean GA: 19w 2d US EDC: 03/29/2021

Assigned GA:  19w 0d Assigned EDC: 03/31/2021

Estimated Fetal Weight:  291g 70%ile

FETAL ANATOMY

Lateral Ventricles: Appears normal

Thalami/CSP: Appears normal

Posterior Fossa:  Appears normal

Nuchal Region: Appears normal   NFT= 3.6 mm

Upper Lip: Not visualized

Spine: Not visualized

4 Chamber Heart on Left: Appears normal

LVOT: Appears normal

RVOT: Appears normal

Stomach on Left: Appears normal

3 Vessel Cord: Appears normal

Cord Insertion site: Appears normal

Kidneys: Appears normal

Bladder: Appears normal

Extremities: Appears normal

Sex: Male

Technically difficult due to: Maternal habitus

Maternal Findings:

Cervix:  Closed.  3.3 cm.
IMPRESSION: 1. Single live intrauterine gestation in breech presentation.
2. Assigned gestational age of 19 weeks 0 days. Adequate interval
growth.
3. Limited visualization of the fetal spine and nose/upper lip.
Attention on follow-up.
4. Remainder of the fetal anatomic survey is within normal limits.
No fetal anomaly detected.

## 2021-12-01 ENCOUNTER — Ambulatory Visit (INDEPENDENT_AMBULATORY_CARE_PROVIDER_SITE_OTHER): Payer: Medicaid Other

## 2021-12-01 ENCOUNTER — Ambulatory Visit (HOSPITAL_COMMUNITY)
Admission: EM | Admit: 2021-12-01 | Discharge: 2021-12-01 | Disposition: A | Discharge status: Home / Self Care | Payer: Medicaid Other | Attending: Internal Medicine | Admitting: Internal Medicine

## 2021-12-01 ENCOUNTER — Encounter (HOSPITAL_COMMUNITY): Payer: Self-pay

## 2021-12-01 DIAGNOSIS — R1012 Left upper quadrant pain: Secondary | ICD-10-CM | POA: Diagnosis not present

## 2021-12-01 DIAGNOSIS — K5900 Constipation, unspecified: Secondary | ICD-10-CM | POA: Diagnosis not present

## 2021-12-01 DIAGNOSIS — R109 Unspecified abdominal pain: Secondary | ICD-10-CM | POA: Diagnosis not present

## 2021-12-01 LAB — POCT URINALYSIS DIPSTICK, ED / UC
Bilirubin Urine: NEGATIVE
Glucose, UA: NEGATIVE mg/dL
Ketones, ur: NEGATIVE mg/dL
Leukocytes,Ua: NEGATIVE
Nitrite: NEGATIVE
Protein, ur: NEGATIVE mg/dL
Specific Gravity, Urine: 1.025 (ref 1.005–1.030)
Urobilinogen, UA: 0.2 mg/dL (ref 0.0–1.0)
pH: 6 (ref 5.0–8.0)

## 2021-12-01 LAB — POC URINE PREG, ED: Preg Test, Ur: NEGATIVE

## 2021-12-01 MED ORDER — KETOROLAC TROMETHAMINE 30 MG/ML IJ SOLN
INTRAMUSCULAR | Status: AC
  Filled 2021-12-01: qty 1

## 2021-12-01 MED ORDER — POLYETHYLENE GLYCOL 3350 17 GM/SCOOP PO POWD: 1.0000 | Freq: Once | ORAL | 0 refills | Status: AC

## 2021-12-01 MED ORDER — KETOROLAC TROMETHAMINE 30 MG/ML IJ SOLN
30.0000 mg | Freq: Once | INTRAMUSCULAR | Status: AC
  Administered 2021-12-01: 30 mg via INTRAMUSCULAR

## 2021-12-01 MED ORDER — DOCUSATE SODIUM 100 MG PO CAPS: 100.0000 mg | ORAL_CAPSULE | Freq: Every day | ORAL | 0 refills | Status: DC

## 2021-12-01 NOTE — ED Provider Notes (Signed)
[facility]    CSN: [id number] Arrival date & time: 12/01/21  0932      History   Chief Complaint Chief Complaint  Patient presents with   Flank Pain    left    HPI Meredith Hubbard is a 26 y.o. female.   Patient presents urgent care for evaluation of pain to the left side that started on Monday, November 29, 2021 all of a sudden when she woke up in the morning.  She states that the pain has become progressively worse over the last 2 to 3 days to the point where she is unable to find a comfortable position to sit or sleep in.  Pain is worse with stretching and movement.  She cannot identify any factors that make the pain better.  She has attempted use of Tylenol, ibuprofen, "muscle rub" and Goody powders at home without much relief of her symptoms and pain.  Denies recent falls, trauma/injury to the area, and urinary symptoms.  No fever/chills, vaginal discharge, exposure to known STIs, constipation, diarrhea, or acid reflux.  States that she does not use ibuprofen regularly and normally eats when she takes this medicine.  She denies eating spicy, fatty, or greasy foods excessively.  No nausea, vomiting, or abdominal discomfort reported.  Patient drinks plenty of water and does not drink excessive amounts of known urinary irritants.  States that the pain is not worse when she presses on her side and she believes that this may be "something internal".  She describes the pain as a 10 on a scale of 0-10 at this time and sometimes feels it "pulsating/spasming" but states that the pain is constant.  States that her sister has recently been diagnosed with kidney stones but denies personal history of kidney stones.  Patient denies gross hematuria, vaginal bleeding, vaginal discharge, and vaginal itching.  She has an IUD in place and states that she bled for 3 months from the vagina after receiving this IUD but has not had any vaginal bleeding since May 2023.  No recent new sexual partners reported.   No other aggravating or relieving factors identified at this time.   Flank Pain    Past Medical History:  Diagnosis Date   Herpes    HSV-2 infection    Normal spontaneous vaginal delivery 03/28/2021    Patient Active Problem List   Diagnosis Date Noted   IUD (intrauterine device) in place 05/24/2021   Postpartum care following vaginal delivery 03/28/2021   Iron deficiency 02/18/2021   Marijuana use 08/25/2020   Supervision of other normal pregnancy, antepartum 08/13/2020    Past Surgical History:  Procedure Laterality Date   OPEN REDUCTION INTERNAL FIXATION (ORIF) TIBIA/FIBULA FRACTURE Right    age 89    OB History     Gravida  2   Para  2   Term  2   Preterm      AB      Living  2      SAB      IAB      Ectopic      Multiple  0   Live Births  1            Home Medications    Prior to Admission medications   Medication Sig Start Date End Date Taking? Authorizing Provider  polyethylene glycol powder (GLYCOLAX/MIRALAX) 17 GM/SCOOP powder Take 255 g by mouth once for 1 dose. 12/01/21 12/01/21 Yes Carlisle Beers, FNP  diphenhydrAMINE HCl (ALLERGY MED PO)  Take 1 tablet by mouth daily as needed (allergies).    [provider]  Doxylamine-Phenylephrine-APAP (SINUS & CONGESTION DAY/NIGHT PO) Take 2 tablets by mouth 2 (two) times daily as needed (congestion).    [provider]  triamcinolone (NASACORT) 55 MCG/ACT AERO nasal inhaler Place 2 sprays into the nose daily. Patient not taking: Reported on 05/24/2021 05/18/21   Hazel Sams, PA-C    Family History Family History  Problem Relation Age of Onset   Hypertension Mother    Diabetes Maternal Uncle    Diabetes Other     Social History Social History   Tobacco Use   Smoking status: Never   Smokeless tobacco: Never  Vaping Use   Vaping Use: Never used  Substance Use Topics   Alcohol use: Never   Drug use: Yes    Types: Marijuana     Allergies    Codeine   Review of Systems Review of Systems  Genitourinary:  Positive for flank pain.  Per HPI   Physical Exam Triage Vital Signs ED Triage Vitals  Enc Vitals Group     BP 12/01/21 1006 138/89     Pulse Rate 12/01/21 1006 99     Resp 12/01/21 1006 16     Temp 12/01/21 1006 98.3 F (36.8 C)     Temp Source 12/01/21 1006 Oral     SpO2 12/01/21 1006 96 %     Weight 12/01/21 1010 210 lb (95.3 kg)     Height 12/01/21 1010 5\' 1"  (1.549 m)     Head Circumference --      Peak Flow --      Pain Score 12/01/21 1009 10     Pain Loc --      Pain Edu? --      Excl. in [place]? --    No data found.  Updated Vital Signs BP 138/89 (BP Location: Right Arm)   Pulse 99   Temp 98.3 F (36.8 C) (Oral)   Resp 16   Ht 5\' 1"  (1.549 m)   Wt 210 lb (95.3 kg)   LMP 09/07/2021   SpO2 96%   BMI 39.68 kg/m   Visual Acuity Right Eye Distance:   Left Eye Distance:   Bilateral Distance:    Right Eye Near:   Left Eye Near:    Bilateral Near:     Physical Exam Vitals and nursing note reviewed.  Constitutional:      Appearance: Normal appearance. She is not ill-appearing or toxic-appearing.     Comments: Very pleasant patient sitting on exam in position of comfort table in no acute distress.   HENT:     Head: Normocephalic and atraumatic.     Right Ear: Hearing and external ear normal.     Left Ear: Hearing and external ear normal.     Nose: Nose normal.     Mouth/Throat:     Lips: Pink.     Mouth: Mucous membranes are moist.     Pharynx: No posterior oropharyngeal erythema.  Eyes:     General: Lids are normal. Vision grossly intact. Gaze aligned appropriately.     Extraocular Movements: Extraocular movements intact.     Conjunctiva/sclera: Conjunctivae normal.  Cardiovascular:     Rate and Rhythm: Normal rate and regular rhythm.     Heart sounds: Normal heart sounds, S1 normal and S2 normal.  Pulmonary:     Effort: Pulmonary effort is normal. No respiratory distress.      Breath sounds:  Normal breath sounds and air entry.  Abdominal:     General: Bowel sounds are normal.     Palpations: Abdomen is soft.     Tenderness: There is no abdominal tenderness. There is no right CVA tenderness, left CVA tenderness or guarding.  Musculoskeletal:     Cervical back: Neck supple.     Comments: Pain to the left lateral abdomen/back area inferior to the left lateral axilla/breast region.  Pain is not worse or better with palpation.  Normal range of motion of the left upper extremity present.  5/5 grip strength to bilateral upper extremities.  Normal sensation.  No ecchymosis, lacerations, or evidence of injury present to the skin.  No lymph node swelling of the axillary region.  Patient appears to be uncomfortable on exam table and struggles to get herself into a position of comfort while sitting.  Skin:    General: Skin is warm and dry.     Capillary Refill: Capillary refill takes less than 2 seconds.     Findings: No rash.  Neurological:     General: No focal deficit present.     Mental Status: She is alert and oriented to person, place, and time. Mental status is at baseline.     Cranial Nerves: No dysarthria or facial asymmetry.     Gait: Gait is intact.  Psychiatric:        Mood and Affect: Mood normal.        Speech: Speech normal.        Behavior: Behavior normal.        Thought Content: Thought content normal.        Judgment: Judgment normal.      UC Treatments / Results  Labs (all labs ordered are listed, but only abnormal results are displayed) Labs Reviewed  POCT URINALYSIS DIPSTICK, ED / UC - Abnormal; Notable for the following components:      Result Value   Hgb urine dipstick MODERATE (*)    All other components within normal limits  POC URINE PREG, ED    EKG   Radiology DG Abd 2 Views  Result Date: 12/01/2021 CLINICAL DATA:  Left abdominal pain EXAM: ABDOMEN - 2 VIEW COMPARISON:  None Available. FINDINGS: Moderate amount of stool in the  colon especially the right colon. No bowel obstruction or ileus. Negative for urinary tract calculi. IUD noted in the pelvis. No skeletal abnormality Right hemidiaphragm not included on the upright view. Recommend repeat upright view if there is clinical concern of free air. IMPRESSION: Moderate amount of stool in the colon. Nonobstructive bowel gas pattern Right hemidiaphragm not included on the upright view. Repeat if indicated. These results will be called to the ordering clinician or representative by the Radiologist Assistant, and communication documented in the PACS or [employer]. Electronically Signed   By: Marlan Palau M.D.   On: 12/01/2021 11:18    Procedures Procedures (including critical care time)  Medications Ordered in UC Medications  ketorolac (TORADOL) 30 MG/ML injection 30 mg (30 mg Intramuscular Given 12/01/21 1104)    Initial Impression / Assessment and Plan / UC Course  I have reviewed the triage vital signs and the nursing notes.  Pertinent labs & imaging results that were available during my care of the patient were reviewed by me and considered in my medical decision making (see chart for details).  1.  Constipation Abdominal x-ray shows moderate stool burden without evidence of other intra-abdominal pathology.  Stool burden is in the area  patient's greatest amount of tenderness to the left lateral abdomen based on x-ray image.  Patient to begin taking MiraLAX twice daily for the next few days, then once daily for 5 days, then as needed.  Advised patient to increase water intake while taking this medication for it to work best in her body.  She may also start taking Colace stool softener once daily to soften stools and ease bowel movements.  She may continue taking ibuprofen 600 mg as needed at home for aches and pains with food.  Advised patient to increase fiber in her diet by eating more whole grains and fruits and vegetables.  Follow-up with PCP as needed.  Follow-up  with urgent care as needed.  PCP assistance initiated today.  Patient given information regarding self scheduling PCP appointment via [facility] health website.  Advised patient of importance of maintaining primary care provider for health maintenance and prevention.   Discussed physical exam and available lab work findings in clinic with patient.  Counseled patient regarding appropriate use of medications and potential side effects for all medications recommended or prescribed today. Discussed red flag signs and symptoms of worsening condition,when to call the PCP office, return to urgent care, and when to seek higher level of care in the emergency department. Patient verbalizes understanding and agreement with plan. All questions answered. Patient discharged in stable condition.  Final Clinical Impressions(s) / UC Diagnoses   Final diagnoses:  Constipation, unspecified constipation type  Left upper quadrant abdominal pain     Discharge Instructions      Your x-ray shows that you are constipated.  Take MiraLAX daily for the next few days until you are able to have regular bowel movements.  After 2 to 3 days of taking MiraLAX twice daily, begin taking this once daily for 5 days, then as needed in the future.  To take this medicine, place 1 capful of MiraLAX into 8 ounces of liquid (water, Gatorade, or juice) and drink this promptly.  Increase your water intake to at least 8 cups of water per day since MiraLAX works best by drying water to the gut to thin your stool so that you are able to get out of your body easier.   You may start taking Colace stool softener once daily to soften your stool so that you are able to have normal bowel movements.  If you develop any new or worsening symptoms or do not improve in the next 2 to 3 days, please return.  If your symptoms are severe, please go to the emergency room.  Follow-up with your primary care provider for further evaluation and management of your  symptoms as well as ongoing wellness visits.  I hope you feel better!  To find a primary care provider go to [email or web address] and follow the prompts to schedule a new patient appointment for primary care.  Someone from [facility] health will be reaching out to you either via MyChart or phone call to help you set up a primary care provider appointment as well.  It is very important to have a primary care provider to manage your overall health and prevent/manage chronic medical conditions.      ED Prescriptions     Medication Sig Dispense Auth. Provider   polyethylene glycol powder (GLYCOLAX/MIRALAX) 17 GM/SCOOP powder Take 255 g by mouth once for 1 dose. 255 g Talbot Grumbling, FNP      PDMP not reviewed this encounter.   Talbot Grumbling, Roxie 12/01/21 1137

## 2021-12-01 NOTE — Discharge Instructions (Addendum)
Your x-ray shows that you are constipated.  Take MiraLAX daily for the next few days until you are able to have regular bowel movements.  After 2 to 3 days of taking MiraLAX twice daily, begin taking this once daily for 5 days, then as needed in the future.  To take this medicine, place 1 capful of MiraLAX into 8 ounces of liquid (water, Gatorade, or juice) and drink this promptly.  Increase your water intake to at least 8 cups of water per day since MiraLAX works best by drying water to the gut to thin your stool so that you are able to get out of your body easier.   You may start taking Colace stool softener once daily to soften your stool so that you are able to have normal bowel movements.  If you develop any new or worsening symptoms or do not improve in the next 2 to 3 days, please return.  If your symptoms are severe, please go to the emergency room.  Follow-up with your primary care provider for further evaluation and management of your symptoms as well as ongoing wellness visits.  I hope you feel better!  To find a primary care provider go to [email or web address] and follow the prompts to schedule a new patient appointment for primary care.  Someone from [facility] health will be reaching out to you either via MyChart or phone call to help you set up a primary care provider appointment as well.  It is very important to have a primary care provider to manage your overall health and prevent/manage chronic medical conditions.

## 2021-12-01 NOTE — ED Triage Notes (Addendum)
Pt complains of left side pain . Pt describes pain to be sharp x3days. Pt denies fever. Pt denies urinary symptoms

## 2022-03-21 IMAGING — US US OB FOLLOW-UP
1 series · 15 of 28 positions shown · non-contrast
Comparison: none

CLINICAL DATA: Large for dates. By assigned EDC of 03/31/2021 the
patient is 36 weeks 5 days.

EXAM:
OBSTETRIC 14+ WK ULTRASOUND FOLLOW-UP

[Series 1: us ob follow up · 15 of 47 slices shown]
[im 1/47]
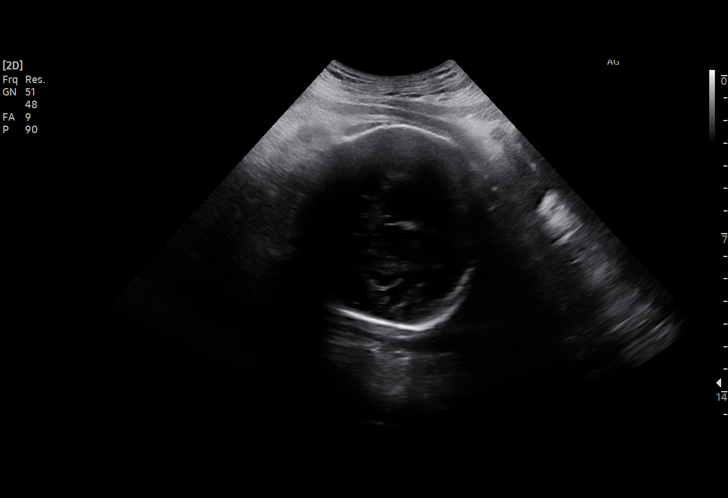
[im 4/47]
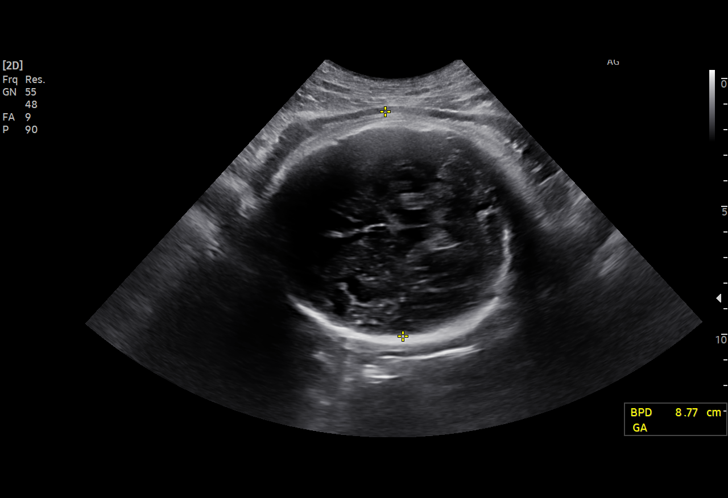
[im 7/47]
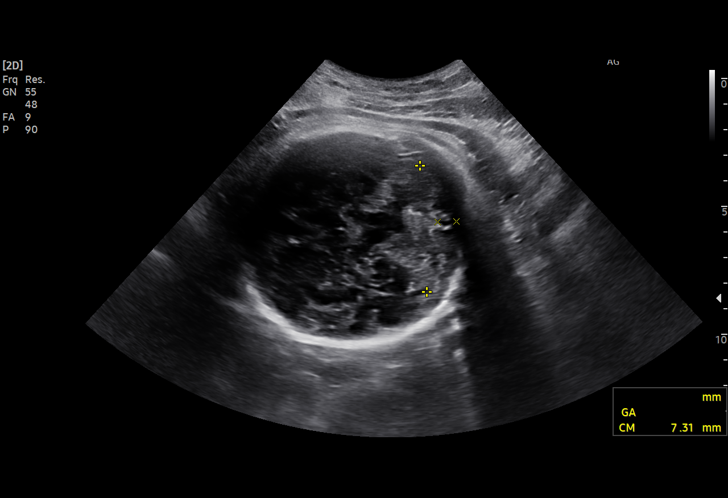
[im 11/47]
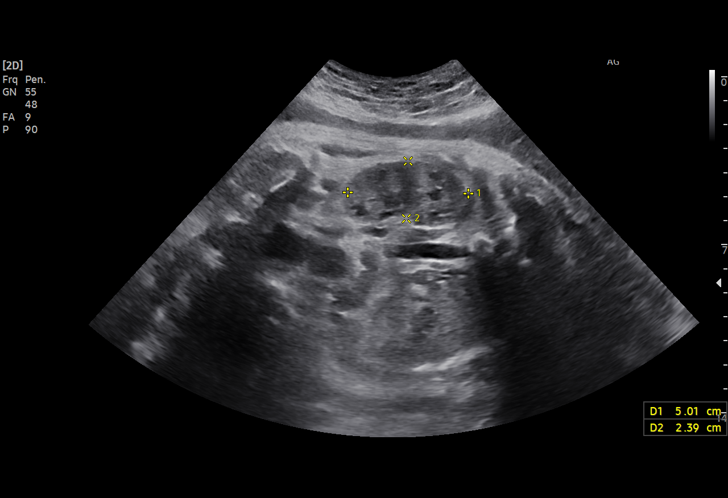
[im 14/47]
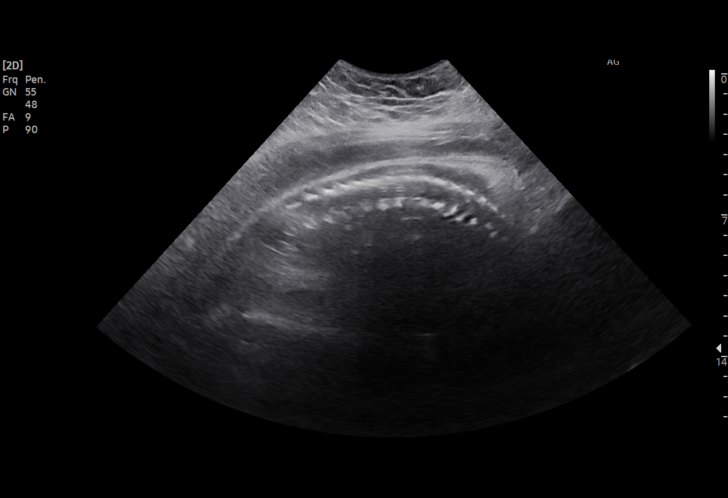
[im 18/47]
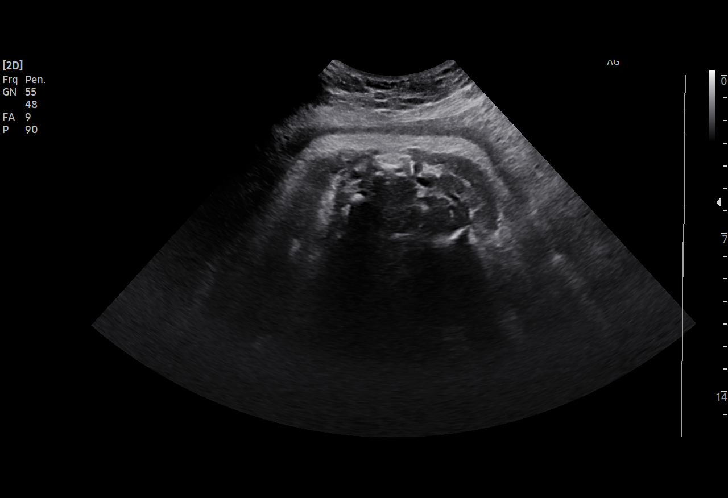
[im 21/47]
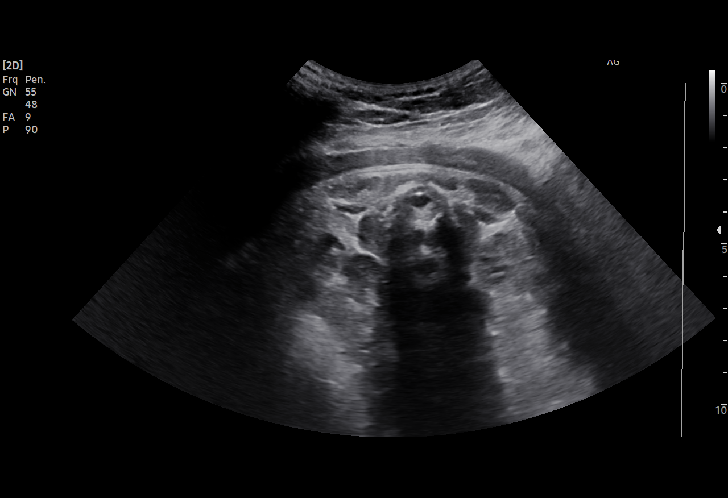
[im 24/47]
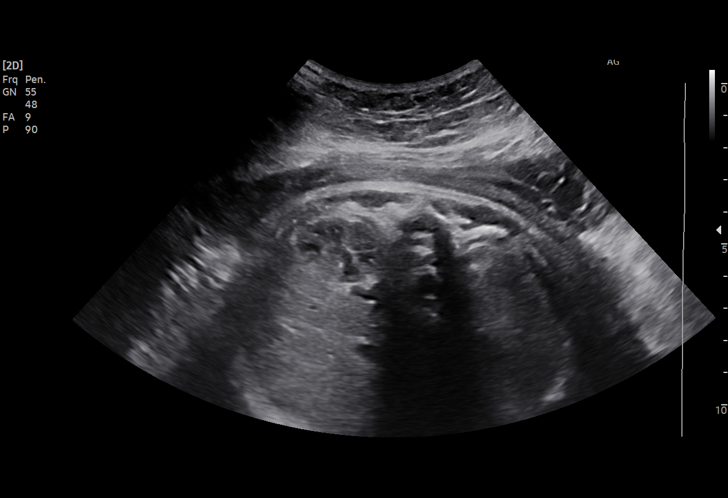
[im 26/47]
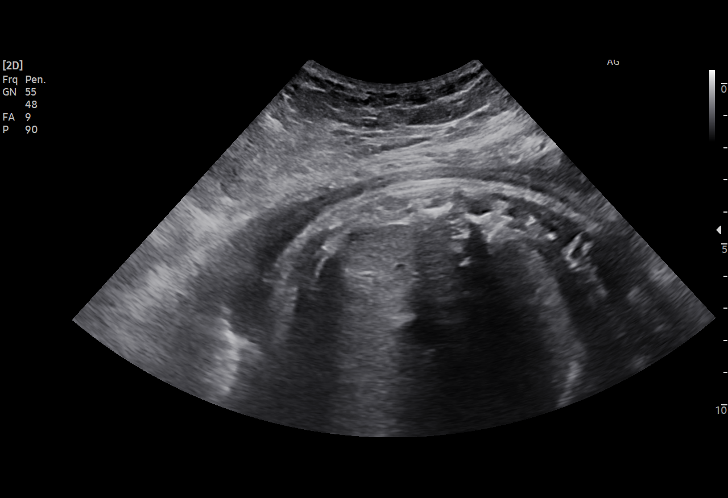
[im 29/47]
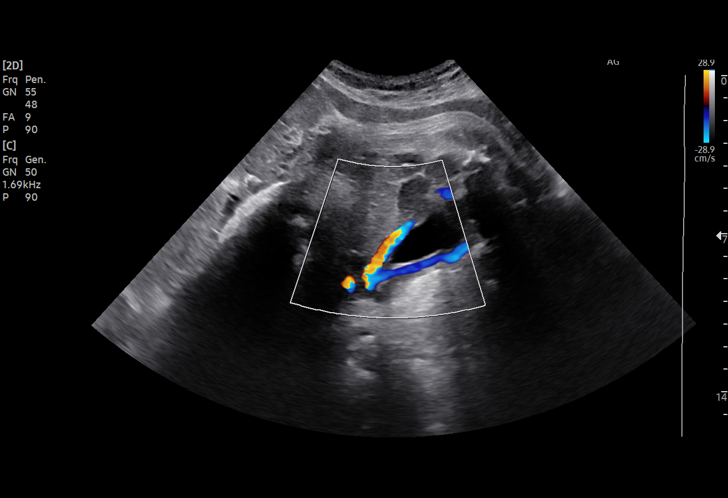
[im 33/47]
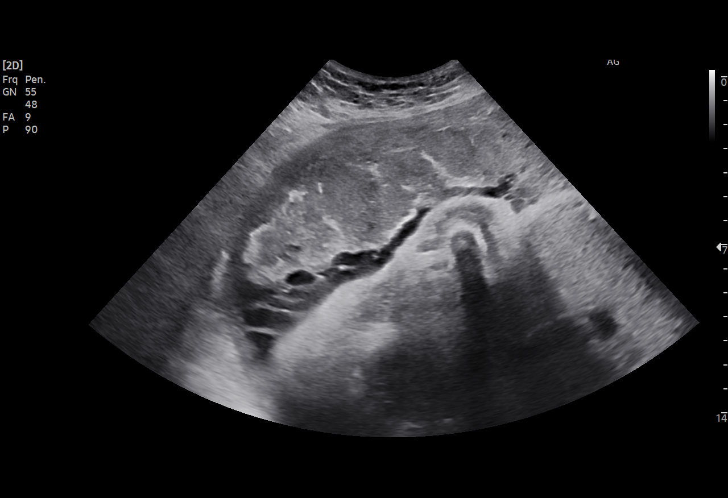
[im 36/47]
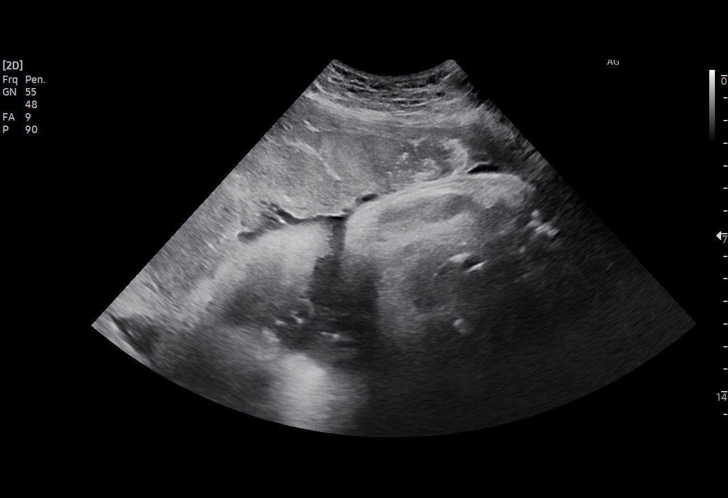
[im 40/47]
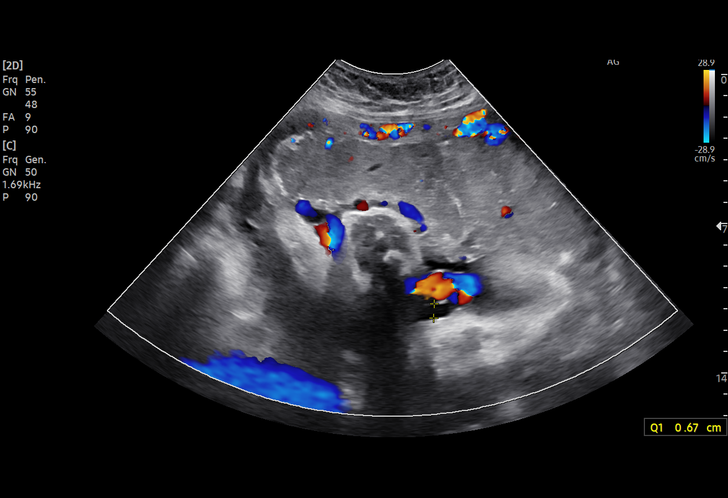
[im 43/47]
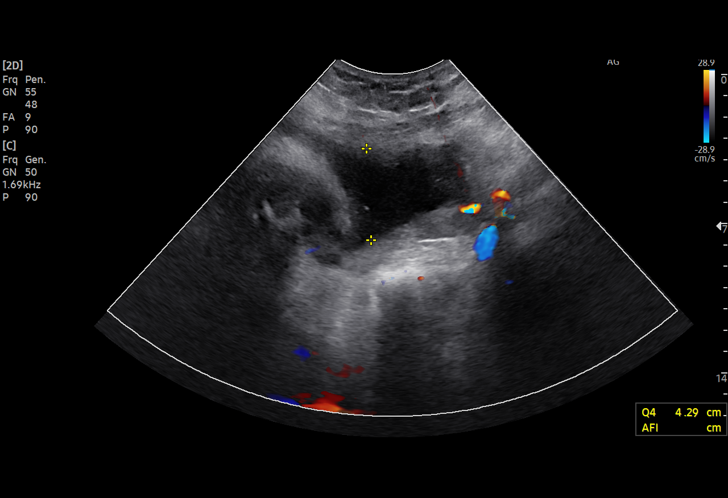
[im 47/47]
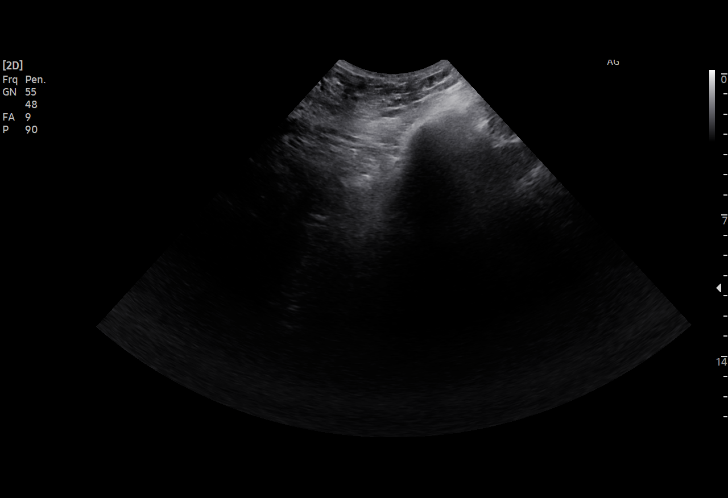

[15 of 28 positions shown; findings below may reference images not displayed]

FINDINGS: Number of Fetuses: 1

Heart Rate:  135 bpm

Movement: Present

Presentation: Cephalic

Previa: None

Placental Location: Anterior

Amniotic Fluid (Subjective): Normal

Amniotic Fluid (Objective):

AFI 12.1 cm (5%ile= 7.7 cm, 95%= 24.9 cm for 36 wks)

FETAL BIOMETRY

BPD:  8.8cm 35w 3d

HC:    32.0cm 6w 0d

AC:    34.7cm 38w 4d

FL:    7.0cm 35w 6d

Current Mean GA: 36w 3d US EDC: 04/02/2021

Assigned GA: 36w 5d Assigned EDC: 03/31/2021

Estimated Fetal Weight:  3,168g 77.5%ile

Previous estimated fetal weight: 704 grams, 57.4 percentile

FETAL ANATOMY

Lateral Ventricles: Appears normal

Thalami/CSP: Appears normal

Posterior Fossa: Appears normal

Nuchal Region: Previously seen

Upper Lip: Previously seen

Spine: Appears normal

4 Chamber Heart on Left: Previously seen

LVOT: Previously seen

RVOT: Previously seen

Stomach on Left: Previously seen

3 Vessel Cord: Previously seen

Cord Insertion site: Previously seen

Kidneys: Appears normal

Bladder: Appears normal

Extremities: Previously seen

Sex: Male

Technical Limitations: None

Maternal Findings:

Cervix:  Not assessed
IMPRESSION: 1. Single living intrauterine fetus in cephalic presentation.
2. Size and dates correlate well. Estimated fetal weight is 1277
grams.
3. No fetal anomalies identified.
4. Amniotic fluid volume is normal.

## 2022-03-29 ENCOUNTER — Ambulatory Visit: Payer: Medicaid Other | Admitting: Family Medicine

## 2022-06-30 ENCOUNTER — Ambulatory Visit: Payer: Medicaid Other | Attending: Family Medicine | Admitting: Family Medicine

## 2022-06-30 ENCOUNTER — Encounter: Payer: Self-pay | Admitting: Family Medicine

## 2022-06-30 VITALS — BP 116/77 | HR 88 | Temp 98.0°F | Ht 61.0 in | Wt 215.0 lb

## 2022-06-30 DIAGNOSIS — Z13228 Encounter for screening for other metabolic disorders: Secondary | ICD-10-CM

## 2022-06-30 DIAGNOSIS — R42 Dizziness and giddiness: Secondary | ICD-10-CM | POA: Diagnosis not present

## 2022-06-30 DIAGNOSIS — K5909 Other constipation: Secondary | ICD-10-CM

## 2022-06-30 DIAGNOSIS — M549 Dorsalgia, unspecified: Secondary | ICD-10-CM | POA: Diagnosis not present

## 2022-06-30 DIAGNOSIS — L409 Psoriasis, unspecified: Secondary | ICD-10-CM

## 2022-06-30 MED ORDER — TIZANIDINE HCL 4 MG PO TABS: 4.0000 mg | ORAL_TABLET | Freq: Every day | ORAL | 0 refills | Status: DC

## 2022-06-30 MED ORDER — LINACLOTIDE 72 MCG PO CAPS: 72.0000 ug | ORAL_CAPSULE | Freq: Every day | ORAL | 1 refills | Status: DC

## 2022-06-30 MED ORDER — MECLIZINE HCL 25 MG PO TABS: 25.0000 mg | ORAL_TABLET | Freq: Three times a day (TID) | ORAL | 0 refills | Status: AC | PRN

## 2022-06-30 MED ORDER — TAZAROTENE 0.045 % EX LOTN: TOPICAL_LOTION | CUTANEOUS | 1 refills | Status: DC

## 2022-06-30 NOTE — Patient Instructions (Signed)
Psoriasis Psoriasis is a long-term (chronic) skin condition. It causes raised, red patches (plaques) that look silvery on your skin. The patches may be on all areas of your body and can be any size or shape. Symptoms can range from mild to very bad. This condition cannot be passed from one person to another (is not contagious). What are the causes? The exact cause of psoriasis is not known. It occurs because the body's defense system (immune system) attacks healthy skin. This causes the patches. Some things can make the condition worse. These are: Skin damage, such as cuts, scrapes, sunburn, and dryness. Not getting enough sunlight. Some medicines. Alcohol. Tobacco. Stress. Infections. What increases the risk? You are more likely to develop this condition if you: Have a family member with psoriasis. Are very overweight (obese). Are 67-64 years old. Take certain medicines. What are the signs or symptoms? There are different types of psoriasis. You can have more than one type. The types are: Plaque. This is the most common. Symptoms include red, raised patches with a silvery coating. These may be itchy. Your nails may be crumbly or fall off. Guttate. Symptoms include small red spots on your stomach area, arms, and legs. These may happen after you have been sick, especially with strep throat. Inverse. Symptoms include patches in your armpits, under your breasts, private areas, or on your butt. Pustular. Symptoms include swollen, red, pus-filled bumps that hurt on the palms of your hands or the soles of your feet. You also may feel very tired, weak, have a fever, and not be hungry. Erythrodermic. Symptoms include bright red skin that looks burned. You may have a fast heartbeat and a body temperature that is too high or too low. You may be itchy or in pain. Sebopsoriasis. Symptoms include red patches on your scalp, forehead, and face that are greasy. Psoriatic arthritis. Symptoms include  swollen, painful joints along with scaly skin patches. Your nails may be crumbly or fall off. There are times when symptoms may get worse (flares). How is this treated? There is no cure for this condition, but treatment can: Help your skin heal. Help with itching. Help with irritation and swelling (inflammation). Slow the growth of new skin cells. Help your body's defense system respond better to your skin. Help with any related conditions. Psoriasis can increase your risk of other conditions, such as: Heart disease. High blood pressure. Eye problems. Depression. Treatment may include: Creams or ointments. Light therapy. This may include natural sunlight or light therapy in a doctor's office. Medicines. These can help your body better manage skin cells. They may be used with light therapy or ointments. Medicines may include pills or injections. You may also get antibiotic medicines if you have an infection. These may include: Systemic therapy medicines. These medicines: Can help your body manage how the skin cells grow. Can help with irritation and swelling. May be given as pills or injections. Biologic medicines. These medicines: Are usually given through an IV or as injections. Are helpful for people with very bad symptoms. Have a higher risk of infection. Follow these instructions at home: Skin Care Use lotion on your skin as needed. Only use lotions that your doctor has said are okay. Put cool, wet cloths (cold compresses) on the affected areas. Do not use a hot tub or take hot showers. Use slightly warm water when taking showers and baths. Do not scratch your skin. Lifestyle Keep a healthy weight. Do not eat a lot of foods that have  a lot of solid fats, added sugars, or salt in them. Eat a healthy diet that includes lots of: Vegetables. Fruit. Whole grains. Low-fat dairy products. Lean proteins. Do not smoke or use any products that contain nicotine or tobacco. If you  need help quitting, ask your doctor. Lower your stress. Go out in the sun as told by your doctor. Do not get sunburned. Join a support group. General instructions  Take or use over-the-counter and prescription medicines only as told by your doctor. Track the things that cause symptoms (triggers). Try to avoid these things. Do not drink alcohol if your doctor tells you not to drink. See a counselor if you feel the support would help. Keep all follow-up visits. Your doctor will check on your condition to make sure it does not get worse or cause problems. Where to find [facility]: www.psoriasis.org Where to find more information American Academy [employer] D[employer]atology: http://jones-macias.info/ Contact a doctor if: You have a fever or chills. Your signs or symptoms get worse. You have more redness or warmth in the affected areas. You have new or worse pain or stiffness in your joints. Your nails break easily or pull away from the nail bed. You feel very sad (depressed), frustrated, or hopeless. Get help right away if: You have thoughts of hurting yourself or others. Get help right away if you feel like you may hurt yourself or others, or have thoughts about taking your own life. Go to your nearest emergency room or: Call 911. Call the [facility] at (317) [phone] or 988. This is open 24 hours a day. Text the Crisis Text Line at 9058251294. Summary Psoriasis is a long-term (chronic) skin condition. There is no cure for this condition, but treatment can help. Track the things that cause symptoms. Take or use over-the-counter and prescription medicines only as told by your doctor. Keep all follow-up visits. This information is not intended to replace advice given to you by your health care provider. Make sure you discuss any questions you have with your health care provider. Document Revised: 06/16/2021 Document Reviewed: 06/16/2021 Elsevier Patient Education   [employer].

## 2022-06-30 NOTE — Progress Notes (Signed)
Subjective:  Patient ID: Meredith Hubbard, female    DOB: Jul 02, 1995  Age: 27 y.o. MRN: [id number]  CC: New Patient (Initial Visit)   HPI Meredith Hubbard is a 27 y.o. 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 Bowel movement occurs every 2 days associated with straining but last week she has once episode of hematochezia; she is unsure if she has hemorrhoids.  Dizziness occurred twice last week and she had to go take a nap. It felt like the room was spinning. She had stood up from sitting and then decided to sit again and dizziness occurred. She works from home and is on the computer a lot.  Dry scalp is associated with flaking, itching and occurs on her eye brows, nose, post auricular region. She has used Sulphur 8 shampoo. Sometimes uses eczema cream with mild relief.  Back pain is in bilateral lumbar region and radiates anteriorly but does not radiate to her legs. Pain is a 7-8/10.  Pain is worse on waking up first thing in the morning. Past Medical History:  Diagnosis Date   Herpes    HSV-2 infection    Normal spontaneous vaginal delivery 03/28/2021    Past Surgical History:  Procedure Laterality Date   OPEN REDUCTION INTERNAL FIXATION (ORIF) TIBIA/FIBULA FRACTURE Right    age 27    Family History  Problem Relation Age of Onset   Hypertension Mother    Diabetes Maternal Uncle    Diabetes Other     Social History   Socioeconomic History   Marital status: Married    Spouse name: Darnelle Maffucci   Number of children: Not on file   Years of education: Not on file   Highest education level: Not on file  Occupational History   Not on file  Tobacco Use   Smoking status: Never   Smokeless tobacco: Never  Vaping Use   Vaping Use: Never used  Substance and Sexual Activity   Alcohol use: Never   Drug use: Not Currently    Types:  Marijuana   Sexual activity: Yes    Birth control/protection: I.U.D.  Other Topics Concern   Not on file  Social History Narrative   Not on file   Social Determinants of Health   Financial Resource Strain: Not on file  Food Insecurity: Not on file  Transportation Needs: Not on file  Physical Activity: Not on file  Stress: Not on file  Social Connections: Not on file    Allergies  Allergen Reactions   Codeine Swelling    Outpatient Medications Prior to Visit  Medication Sig Dispense Refill   diphenhydrAMINE HCl (ALLERGY MED PO) Take 1 tablet by mouth daily as needed (allergies). (Patient not taking: Reported on 06/30/2022)     docusate sodium (COLACE) 100 MG capsule Take 1 capsule (100 mg total) by mouth daily. (Patient not taking: Reported on 06/30/2022) 60 capsule 0   Doxylamine-Phenylephrine-APAP (SINUS & CONGESTION DAY/NIGHT PO) Take 2 tablets by mouth 2 (two) times daily as needed (congestion). (Patient not taking: Reported on 06/30/2022)     triamcinolone (NASACORT) 55 MCG/ACT AERO nasal inhaler Place 2 sprays into the nose daily. (Patient not taking: Reported on 05/24/2021) 1 each 1   No facility-administered medications prior to visit.     ROS Review of Systems  Constitutional:  Negative for activity change and appetite  change.  HENT:  Negative for sinus pressure and sore throat.   Respiratory:  Negative for chest tightness, shortness of breath and wheezing.   Cardiovascular:  Negative for chest pain and palpitations.  Gastrointestinal:  Positive for constipation. Negative for abdominal distention and abdominal pain.  Genitourinary: Negative.   Musculoskeletal:  Positive for back pain.  Skin:  Positive for rash.  Neurological:  Positive for dizziness.  Psychiatric/Behavioral:  Negative for behavioral problems and dysphoric mood.     Objective:  BP 116/77   Pulse 88   Temp 98 F (36.7 C) (Oral)   Ht '5\' 1"'$  (1.549 m)   Wt 215 lb (97.5 kg)   SpO2 97%    Breastfeeding No   BMI 40.62 kg/m      06/30/2022    8:50 AM 12/01/2021   10:10 AM 12/01/2021   10:06 AM  BP/Weight  Systolic BP 99991111  0000000  Diastolic BP 77  89  Wt. (Lbs) 215 210   BMI 40.62 kg/m2 39.68 kg/m2       Physical Exam Constitutional:      Appearance: She is well-developed.  HENT:     Ears:     Comments: Negative Dix-Hallpike maneuver Cardiovascular:     Rate and Rhythm: Normal rate.     Heart sounds: Normal heart sounds. No murmur heard. Pulmonary:     Effort: Pulmonary effort is normal.     Breath sounds: Normal breath sounds. No wheezing or rales.  Chest:     Chest wall: No tenderness.  Abdominal:     General: Bowel sounds are normal. There is no distension.     Palpations: Abdomen is soft. There is no mass.     Tenderness: There is no abdominal tenderness.  Musculoskeletal:        General: Normal range of motion.     Right lower leg: No edema.     Left lower leg: No edema.     Comments: No tenderness on lumbar spine palpation Negative straight leg raise bilaterally  Skin:    Comments: Excessive flaking of scalp Silvery plaques in posterior auricular region bilaterally  Neurological:     Mental Status: She is alert and oriented to person, place, and time.  Psychiatric:        Mood and Affect: Mood normal.        Latest Ref Rng & Units 08/05/2021    1:36 AM 08/10/2020    4:42 PM 11/25/2018   12:56 PM  CMP  Glucose 70 - 99 mg/dL 100  70  84   BUN 6 - 20 mg/dL '11  10  11   '$ Creatinine 0.44 - 1.00 mg/dL 0.67  0.65  0.64   Sodium 135 - 145 mmol/L 137  133  139   Potassium 3.5 - 5.1 mmol/L 3.7  3.8  4.0   Chloride 98 - 111 mmol/L 107  102  108   CO2 22 - 32 mmol/L '22  22  24   '$ Calcium 8.9 - 10.3 mg/dL 8.9  9.0  8.8   Total Protein 6.5 - 8.1 g/dL 7.2     Total Bilirubin 0.3 - 1.2 mg/dL 0.6     Alkaline Phos 38 - 126 U/L 95     AST 15 - 41 U/L 25     ALT 0 - 44 U/L 26       Lipid Panel  No results found for: "CHOL", "TRIG", "HDL", "CHOLHDL", "VLDL",  "LDLCALC", "LDLDIRECT"  CBC  Component Value Date/Time   WBC 21.1 (H) 08/05/2021 0136   RBC 4.23 08/05/2021 0136   HGB 12.9 08/05/2021 0136   HGB 9.7 (L) 12/31/2020 0946   HCT 38.8 08/05/2021 0136   HCT 29.0 (L) 12/31/2020 0946   PLT 295 08/05/2021 0136   PLT 285 12/31/2020 0946   MCV 91.7 08/05/2021 0136   MCV 91 12/31/2020 0946   MCH 30.5 08/05/2021 0136   MCHC 33.2 08/05/2021 0136   RDW 12.9 08/05/2021 0136   RDW 12.4 12/31/2020 0946   LYMPHSABS 2.4 08/05/2021 0136   LYMPHSABS 2.3 12/31/2020 0946   MONOABS 1.1 (H) 08/05/2021 0136   EOSABS 0.2 08/05/2021 0136   EOSABS 0.2 12/31/2020 0946   BASOSABS 0.1 08/05/2021 0136   BASOSABS 0.0 12/31/2020 0946    No results found for: "HGBA1C"  Assessment & Plan:  1. Other constipation Uncontrolled on OTC laxatives Counseled on increasing fiber intake, fruits and vegetable, limit intake of foods like cheese, white bread, white rice  - linaclotide (LINZESS) 72 MCG capsule; Take 1 capsule (72 mcg total) by mouth daily before breakfast.  Dispense: 90 capsule; Refill: 1  2. Vertigo Unseld on sedative side effects of meclizine - meclizine (ANTIVERT) 25 MG tablet; Take 1 tablet (25 mg total) by mouth 3 (three) times daily as needed for dizziness.  Dispense: 30 tablet; Refill: 0  3. Scalp psoriasis Uncontrolled Will place on topical treatment and if not responding will refer to dermatology - Tazarotene 0.045 % LOTN; Apply once daily nightly  Dispense: 45 g; Refill: 1  4. Musculoskeletal back pain Likely due to prolonged sitting at her job as she works a Arts development officer to apply heat - tiZANidine (ZANAFLEX) 4 MG tablet; Take 1 tablet (4 mg total) by mouth at bedtime.  Dispense: 90 tablet; Refill: 0  5. Screening for metabolic disorder - LP+Non-HDL Cholesterol - CMP14+EGFR - CBC with Differential/Platelet - Hemoglobin A1c    Meds ordered this encounter  Medications   linaclotide (LINZESS) 72 MCG capsule    Sig: Take 1  capsule (72 mcg total) by mouth daily before breakfast.    Dispense:  90 capsule    Refill:  1   Tazarotene 0.045 % LOTN    Sig: Apply once daily nightly    Dispense:  45 g    Refill:  1   meclizine (ANTIVERT) 25 MG tablet    Sig: Take 1 tablet (25 mg total) by mouth 3 (three) times daily as needed for dizziness.    Dispense:  30 tablet    Refill:  0   tiZANidine (ZANAFLEX) 4 MG tablet    Sig: Take 1 tablet (4 mg total) by mouth at bedtime.    Dispense:  90 tablet    Refill:  0    Follow-up: Return in about 3 months (around 09/30/2022) for Chronic medical conditions.       Charlott Rakes, MD, FAAFP. [facility], [place]   06/30/2022, 9:22 AM

## 2022-06-30 NOTE — Progress Notes (Signed)
Constipation Scalp is dry  Spells of diziness and lightheaded. Having back pain

## 2022-07-01 ENCOUNTER — Telehealth: Payer: Self-pay | Admitting: Family Medicine

## 2022-07-01 ENCOUNTER — Encounter: Payer: Self-pay | Admitting: Internal Medicine

## 2022-07-01 ENCOUNTER — Other Ambulatory Visit: Payer: Self-pay

## 2022-07-01 LAB — CBC WITH DIFFERENTIAL/PLATELET
Basophils Absolute: 0.1 10*3/uL (ref 0.0–0.2)
Basos: 1 %
EOS (ABSOLUTE): 0.2 10*3/uL (ref 0.0–0.4)
Eos: 2 %
Hematocrit: 41.2 % (ref 34.0–46.6)
Hemoglobin: 13.5 g/dL (ref 11.1–15.9)
Immature Grans (Abs): 0 10*3/uL (ref 0.0–0.1)
Immature Granulocytes: 0 %
Lymphocytes Absolute: 4.1 10*3/uL — ABNORMAL HIGH (ref 0.7–3.1)
Lymphs: 50 %
MCH: 29.9 pg (ref 26.6–33.0)
MCHC: 32.8 g/dL (ref 31.5–35.7)
MCV: 91 fL (ref 79–97)
Monocytes Absolute: 0.5 10*3/uL (ref 0.1–0.9)
Monocytes: 6 %
Neutrophils Absolute: 3.4 10*3/uL (ref 1.4–7.0)
Neutrophils: 41 %
Platelets: 294 10*3/uL (ref 150–450)
RBC: 4.51 x10E6/uL (ref 3.77–5.28)
RDW: 12.1 % (ref 11.7–15.4)
WBC: 8.3 10*3/uL (ref 3.4–10.8)

## 2022-07-01 LAB — CMP14+EGFR
ALT: 27 IU/L (ref 0–32)
AST: 20 IU/L (ref 0–40)
Albumin/Globulin Ratio: 1.4 (ref 1.2–2.2)
Albumin: 4.2 g/dL (ref 4.0–5.0)
Alkaline Phosphatase: 118 IU/L (ref 44–121)
BUN/Creatinine Ratio: 15 (ref 9–23)
BUN: 11 mg/dL (ref 6–20)
Bilirubin Total: 0.2 mg/dL (ref 0.0–1.2)
CO2: 23 mmol/L (ref 20–29)
Calcium: 10 mg/dL (ref 8.7–10.2)
Chloride: 104 mmol/L (ref 96–106)
Creatinine, Ser: 0.74 mg/dL (ref 0.57–1.00)
Globulin, Total: 3.1 g/dL (ref 1.5–4.5)
Glucose: 85 mg/dL (ref 70–99)
Potassium: 4.6 mmol/L (ref 3.5–5.2)
Sodium: 143 mmol/L (ref 134–144)
Total Protein: 7.3 g/dL (ref 6.0–8.5)
eGFR: 114 mL/min/{1.73_m2} (ref >59)

## 2022-07-01 LAB — HEMOGLOBIN A1C
Est. average glucose Bld gHb Est-mCnc: 105 mg/dL
Hgb A1c MFr Bld: 5.3 % (ref 4.8–5.6)

## 2022-07-01 LAB — LP+NON-HDL CHOLESTEROL
Cholesterol, Total: 114 mg/dL (ref 100–199)
HDL: 43 mg/dL (ref >39)
LDL Chol Calc (NIH): 53 mg/dL (ref 0–99)
Total Non-HDL-Chol (LDL+VLDL): 71 mg/dL (ref 0–129)
Triglycerides: 96 mg/dL (ref 0–149)
VLDL Cholesterol Cal: 18 mg/dL (ref 5–40)

## 2022-07-01 NOTE — Telephone Encounter (Signed)
Can we start a PA for this patient

## 2022-07-01 NOTE — Telephone Encounter (Signed)
She would have to try 2 of these for a prior auth approval:

## 2022-07-01 NOTE — Telephone Encounter (Signed)
Pt RX for Tazarotene 0.045 % LOTN needs PA/ please advise

## 2022-07-01 NOTE — Telephone Encounter (Signed)
Communicated with Pharmacy and this has been approved.

## 2022-08-03 NOTE — Progress Notes (Unsigned)
PCP:  Hoy Register, MD   No chief complaint on file.    HPI:      Meredith Hubbard is a 27 y.o. G2P2002 whose LMP was No LMP recorded. (Menstrual status: IUD)., presents today for her annual examination.  Her menses are {norm/abn:715}, lasting {number: 22536} days.  Dysmenorrhea {dysmen:716}. She {does:18564} have intermenstrual bleeding.  Sex activity: {sex active: 315163}. Imirena UD placed PP 1/23; wants removed Last Pap: 08/13/20 Results were: no abnormalities /neg HPV DNA  Hx of STDs: {STD hx:14358}  There is no FH of breast cancer. There is no FH of ovarian cancer. The patient {does:18564} do self-breast exams.  Tobacco use: {tob:20664} Alcohol use: {Alcohol:11675} No drug use.  Exercise: {exercise:31265}  She {does:18564} get adequate calcium and Vitamin D in her diet.  Patient Active Problem List   Diagnosis Date Noted   IUD (intrauterine device) in place 05/24/2021   Postpartum care following vaginal delivery 03/28/2021   Iron deficiency 02/18/2021   Marijuana use 08/25/2020   Supervision of other normal pregnancy, antepartum 08/13/2020    Past Surgical History:  Procedure Laterality Date   OPEN REDUCTION INTERNAL FIXATION (ORIF) TIBIA/FIBULA FRACTURE Right    age 71    Family History  Problem Relation Age of Onset   Hypertension Mother    Diabetes Maternal Uncle    Diabetes Other     Social History   Socioeconomic History   Marital status: Married    Spouse name: Feliz Beam   Number of children: Not on file   Years of education: Not on file   Highest education level: Not on file  Occupational History   Not on file  Tobacco Use   Smoking status: Never   Smokeless tobacco: Never  Vaping Use   Vaping Use: Never used  Substance and Sexual Activity   Alcohol use: Never   Drug use: Not Currently    Types: Marijuana   Sexual activity: Yes    Birth control/protection: I.U.D.  Other Topics Concern   Not on file  Social History Narrative   Not on  file   Social Determinants of Health   Financial Resource Strain: Not on file  Food Insecurity: Not on file  Transportation Needs: Not on file  Physical Activity: Not on file  Stress: Not on file  Social Connections: Not on file  Intimate Partner Violence: Not on file     Current Outpatient Medications:    diphenhydrAMINE HCl (ALLERGY MED PO), Take 1 tablet by mouth daily as needed (allergies). (Patient not taking: Reported on 06/30/2022), Disp: , Rfl:    docusate sodium (COLACE) 100 MG capsule, Take 1 capsule (100 mg total) by mouth daily. (Patient not taking: Reported on 06/30/2022), Disp: 60 capsule, Rfl: 0   Doxylamine-Phenylephrine-APAP (SINUS & CONGESTION DAY/NIGHT PO), Take 2 tablets by mouth 2 (two) times daily as needed (congestion). (Patient not taking: Reported on 06/30/2022), Disp: , Rfl:    linaclotide (LINZESS) 72 MCG capsule, Take 1 capsule (72 mcg total) by mouth daily before breakfast., Disp: 90 capsule, Rfl: 1   meclizine (ANTIVERT) 25 MG tablet, Take 1 tablet (25 mg total) by mouth 3 (three) times daily as needed for dizziness., Disp: 30 tablet, Rfl: 0   Tazarotene 0.045 % LOTN, Apply once daily nightly, Disp: 45 g, Rfl: 1   tiZANidine (ZANAFLEX) 4 MG tablet, Take 1 tablet (4 mg total) by mouth at bedtime., Disp: 90 tablet, Rfl: 0   triamcinolone (NASACORT) 55 MCG/ACT AERO nasal inhaler, Place 2  sprays into the nose daily. (Patient not taking: Reported on 05/24/2021), Disp: 1 each, Rfl: 1     ROS:  Review of Systems BREAST: No symptoms   Objective: There were no vitals taken for this visit.   OBGyn Exam  Results: No results found for this or any previous visit (from the past 24 hour(s)).  Assessment/Plan: No diagnosis found.  No orders of the defined types were placed in this encounter.            GYN counsel {counseling: 16159}     F/U  No follow-ups on file.  Onyinyechi Huante B. Talmage Teaster, PA-C 08/03/2022 12:25 PM

## 2022-08-04 ENCOUNTER — Encounter: Payer: Self-pay | Admitting: Obstetrics and Gynecology

## 2022-08-04 ENCOUNTER — Ambulatory Visit (INDEPENDENT_AMBULATORY_CARE_PROVIDER_SITE_OTHER): Payer: Medicaid Other | Admitting: Obstetrics and Gynecology

## 2022-08-04 VITALS — BP 110/80 | Ht 61.0 in | Wt 217.0 lb

## 2022-08-04 DIAGNOSIS — M545 Low back pain, unspecified: Secondary | ICD-10-CM

## 2022-08-04 DIAGNOSIS — Z01419 Encounter for gynecological examination (general) (routine) without abnormal findings: Secondary | ICD-10-CM

## 2022-08-04 DIAGNOSIS — Z30432 Encounter for removal of intrauterine contraceptive device: Secondary | ICD-10-CM

## 2022-08-04 DIAGNOSIS — Z30011 Encounter for initial prescription of contraceptive pills: Secondary | ICD-10-CM

## 2022-08-04 MED ORDER — MICROGESTIN 24 FE 1-20 MG-MCG PO TABS: 1.0000 | ORAL_TABLET | Freq: Every day | ORAL | 3 refills | Status: AC

## 2022-08-04 NOTE — Patient Instructions (Signed)
I value your feedback and you entrusting us with your care. If you get a [facility] patient survey, I would appreciate you taking the time to let us know about your experience today. Thank you! ? ? ?

## 2022-08-18 IMAGING — DX DG CHEST 1V PORT
1 series · 1 of 1 positions shown · non-contrast
Comparison: None.

CLINICAL DATA: Fever and sepsis

EXAM:
PORTABLE CHEST 1 VIEW

[chest]
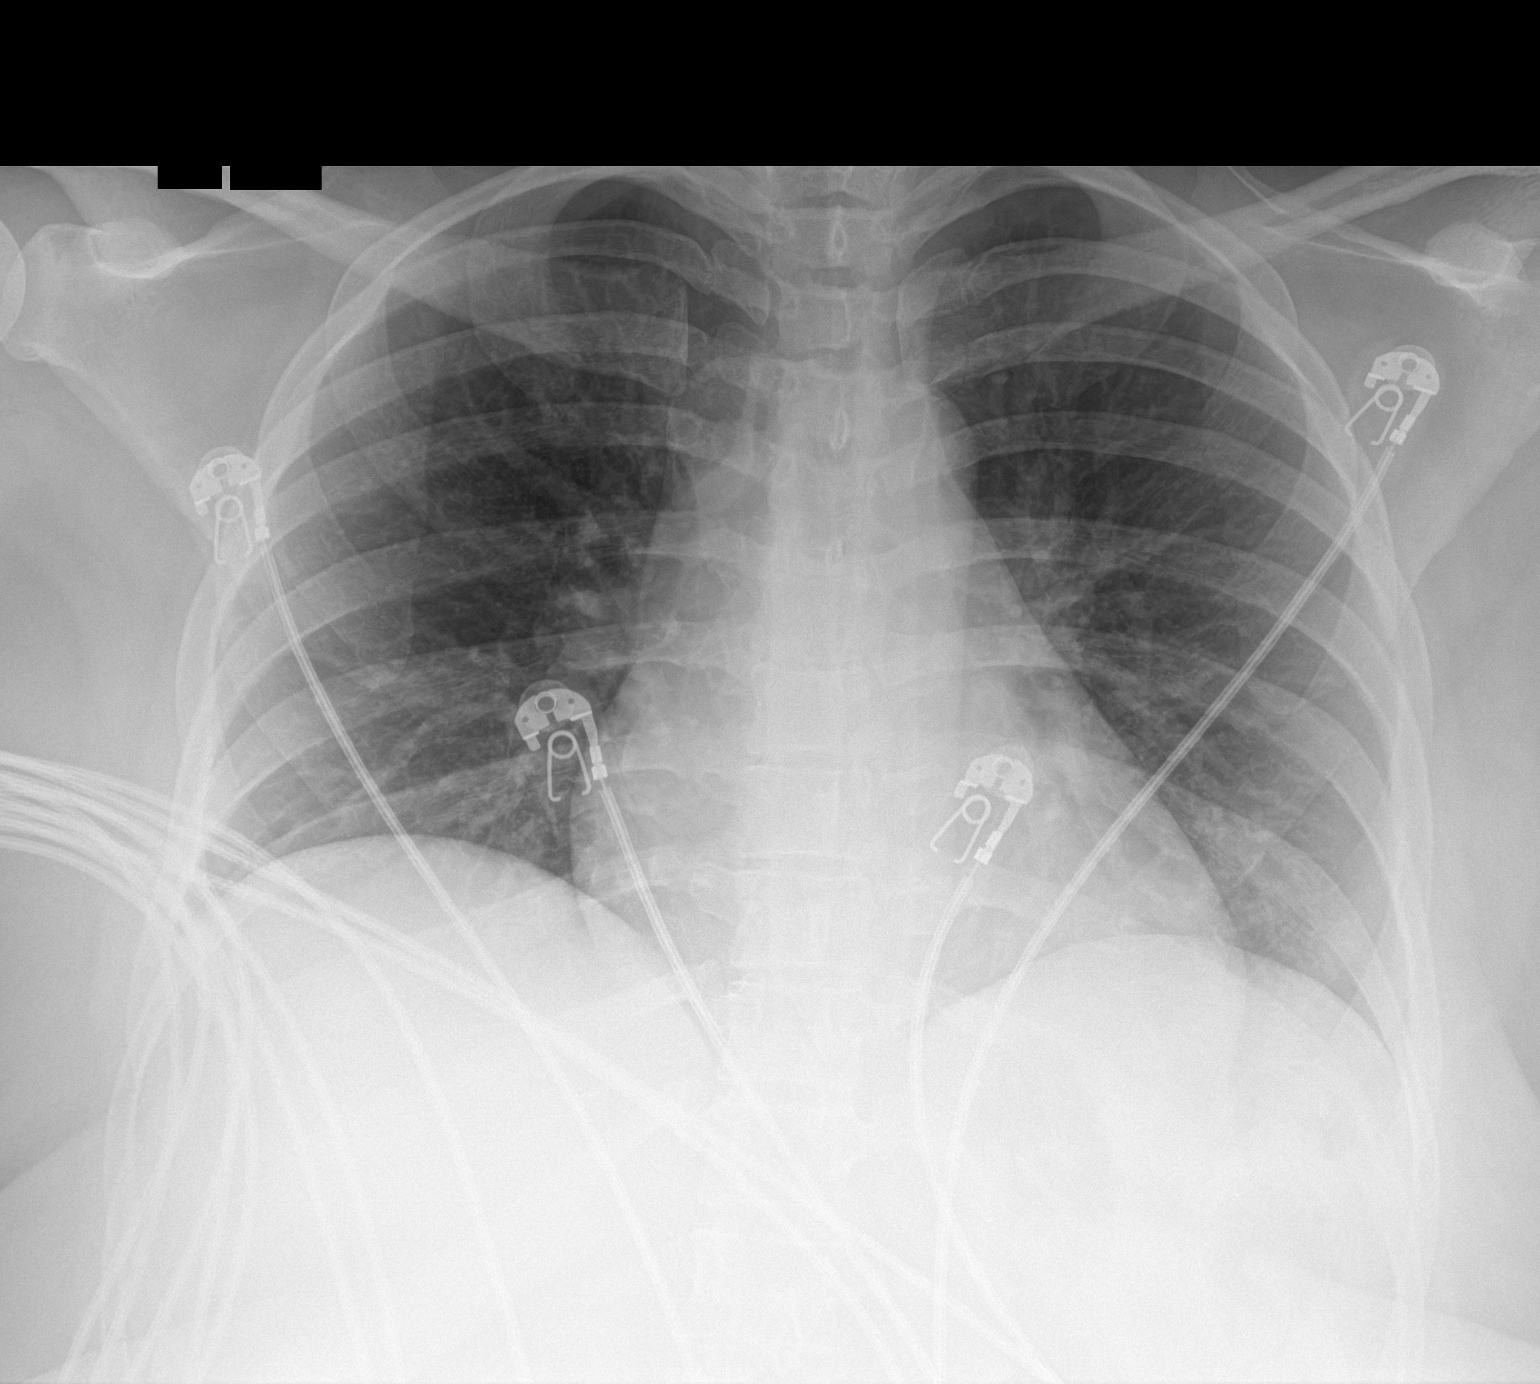

[1 of 1 positions shown; findings below may reference images not displayed]

FINDINGS: The heart size and mediastinal contours are within normal limits.
Both lungs are clear. The visualized skeletal structures are
unremarkable.
IMPRESSION: No active disease.

## 2022-10-03 ENCOUNTER — Ambulatory Visit: Payer: Medicaid Other | Admitting: Family Medicine

## 2022-10-04 ENCOUNTER — Ambulatory Visit: Payer: Medicaid Other | Attending: Family Medicine | Admitting: Family Medicine

## 2022-10-04 DIAGNOSIS — K5909 Other constipation: Secondary | ICD-10-CM | POA: Diagnosis not present

## 2022-10-04 DIAGNOSIS — L409 Psoriasis, unspecified: Secondary | ICD-10-CM | POA: Diagnosis not present

## 2022-10-04 MED ORDER — TAZAROTENE 0.045 % EX LOTN: TOPICAL_LOTION | CUTANEOUS | 6 refills | Status: AC

## 2022-10-04 MED ORDER — LINACLOTIDE 72 MCG PO CAPS
72.0000 ug | ORAL_CAPSULE | Freq: Every day | ORAL | 1 refills | Status: AC
Start: 2022-10-04 — End: ?

## 2022-10-04 NOTE — Progress Notes (Signed)
Medication refills

## 2022-10-04 NOTE — Patient Instructions (Signed)

## 2022-10-04 NOTE — Progress Notes (Signed)
Subjective:  Patient ID: Meredith Hubbard, female    DOB: March 28, 1996  Age: 27 y.o. MRN: [id number]  CC: Constipation   HPI Meredith Hubbard is a 27 y.o. year old female with a history of psoriasis, chronic constipation here for follow-up visit.  Interval History:  The tazarotene lotion has been effective for her Psoriasis. Linzess has been beneficial with regards to her constipation.  She complains this is the heaviest she has been.  She works from home and rarely gets to exercise and endorses snacking a lot.  She is also busy combining her work with caring for 3 kids. Past Medical History:  Diagnosis Date   Herpes    HSV-2 infection    Normal spontaneous vaginal delivery 03/28/2021    Past Surgical History:  Procedure Laterality Date   OPEN REDUCTION INTERNAL FIXATION (ORIF) TIBIA/FIBULA FRACTURE Right    age 27    Family History  Problem Relation Age of Onset   Hypertension Mother    Diabetes Maternal Uncle     Social History   Socioeconomic History   Marital status: Married    Spouse name: Feliz Beam   Number of children: Not on file   Years of education: Not on file   Highest education level: Not on file  Occupational History   Not on file  Tobacco Use   Smoking status: Never   Smokeless tobacco: Never  Vaping Use   Vaping Use: Never used  Substance and Sexual Activity   Alcohol use: Never   Drug use: Not Currently   Sexual activity: Yes    Birth control/protection: I.U.D.    Comment: Mirena  Other Topics Concern   Not on file  Social History Narrative   Not on file   Social Determinants of Health   Financial Resource Strain: Not on file  Food Insecurity: Not on file  Transportation Needs: Not on file  Physical Activity: Not on file  Stress: Not on file  Social Connections: Not on file    Allergies  Allergen Reactions   Codeine Swelling and Other (See Comments)    Outpatient Medications Prior to Visit  Medication Sig Dispense Refill   docusate sodium  (COLACE) 100 MG capsule Take 1 capsule (100 mg total) by mouth daily. 60 capsule 0   meclizine (ANTIVERT) 25 MG tablet Take 1 tablet (25 mg total) by mouth 3 (three) times daily as needed for dizziness. 30 tablet 0   Norethindrone Acetate-Ethinyl Estrad-FE (MICROGESTIN 24 FE) 1-20 MG-MCG(24) tablet Take 1 tablet by mouth daily. 84 tablet 3   tiZANidine (ZANAFLEX) 4 MG tablet Take 1 tablet (4 mg total) by mouth at bedtime. 90 tablet 0   linaclotide (LINZESS) 72 MCG capsule Take 1 capsule (72 mcg total) by mouth daily before breakfast. 90 capsule 1   Tazarotene 0.045 % LOTN Apply once daily nightly 45 g 1   No facility-administered medications prior to visit.     ROS Review of Systems  Constitutional:  Negative for activity change and appetite change.  HENT:  Negative for sinus pressure and sore throat.   Respiratory:  Negative for chest tightness, shortness of breath and wheezing.   Cardiovascular:  Negative for chest pain and palpitations.  Gastrointestinal:  Negative for abdominal distention, abdominal pain and constipation.  Genitourinary: Negative.   Musculoskeletal: Negative.   Psychiatric/Behavioral:  Negative for behavioral problems and dysphoric mood.    Objective:  BP 121/83   Pulse 90   Ht 5\' 1"  (1.549 m)   Wt 217  lb (98.4 kg)   SpO2 99%   BMI 41.00 kg/m      10/04/2022    3:19 PM 08/04/2022    1:39 PM 06/30/2022    8:50 AM  BP/Weight  Systolic BP 121 110 116  Diastolic BP 83 80 77  Wt. (Lbs) 217 217 215  BMI 41 kg/m2 41 kg/m2 40.62 kg/m2      Physical Exam Constitutional:      Appearance: She is well-developed. She is obese.  Cardiovascular:     Rate and Rhythm: Normal rate.     Heart sounds: Normal heart sounds. No murmur heard. Pulmonary:     Effort: Pulmonary effort is normal.     Breath sounds: Normal breath sounds. No wheezing or rales.  Chest:     Chest wall: No tenderness.  Abdominal:     General: Bowel sounds are normal. There is no distension.      Palpations: Abdomen is soft. There is no mass.     Tenderness: There is no abdominal tenderness.  Musculoskeletal:        General: Normal range of motion.     Right lower leg: No edema.     Left lower leg: No edema.  Neurological:     Mental Status: She is alert and oriented to person, place, and time.  Psychiatric:        Mood and Affect: Mood normal.       Latest Ref Rng & Units 06/30/2022    9:32 AM 08/05/2021    1:36 AM 08/10/2020    4:42 PM  CMP  Glucose 70 - 99 mg/dL 85  528  70   BUN 6 - 20 mg/dL 11  11  10    Creatinine 0.57 - 1.00 mg/dL 4.13  2.44  0.10   Sodium 134 - 144 mmol/L 143  137  133   Potassium 3.5 - 5.2 mmol/L 4.6  3.7  3.8   Chloride 96 - 106 mmol/L 104  107  102   CO2 20 - 29 mmol/L 23  22  22    Calcium 8.7 - 10.2 mg/dL 27.2  8.9  9.0   Total Protein 6.0 - 8.5 g/dL 7.3  7.2    Total Bilirubin 0.0 - 1.2 mg/dL 0.2  0.6    Alkaline Phos 44 - 121 IU/L 118  95    AST 0 - 40 IU/L 20  25    ALT 0 - 32 IU/L 27  26      Lipid Panel     Component Value Date/Time   CHOL 114 06/30/2022 0932   TRIG 96 06/30/2022 0932   HDL 43 06/30/2022 0932   LDLCALC 53 06/30/2022 0932    CBC    Component Value Date/Time   WBC 8.3 06/30/2022 0932   WBC 21.1 (H) 08/05/2021 0136   RBC 4.51 06/30/2022 0932   RBC 4.23 08/05/2021 0136   HGB 13.5 06/30/2022 0932   HCT 41.2 06/30/2022 0932   PLT 294 06/30/2022 0932   MCV 91 06/30/2022 0932   MCH 29.9 06/30/2022 0932   MCH 30.5 08/05/2021 0136   MCHC 32.8 06/30/2022 0932   MCHC 33.2 08/05/2021 0136   RDW 12.1 06/30/2022 0932   LYMPHSABS 4.1 (H) 06/30/2022 0932   MONOABS 1.1 (H) 08/05/2021 0136   EOSABS 0.2 06/30/2022 0932   BASOSABS 0.1 06/30/2022 0932    Lab Results  Component Value Date   HGBA1C 5.3 06/30/2022    Assessment & Plan:  1. Other  constipation Controlled Counseled on increasing fiber intake, fruits and vegetable, limit intake of foods like cheese, white bread, white rice - linaclotide (LINZESS)  72 MCG capsule; Take 1 capsule (72 mcg total) by mouth daily before breakfast.  Dispense: 90 capsule; Refill: 1  2. Scalp psoriasis Improved - Tazarotene 0.045 % LOTN; Apply once daily nightly  Dispense: 45 g; Refill: 6  3. Morbid obesity (HCC) Counseled on caloric restriction, avoiding late meals, reducing snacking, healthy food choices, healthy snacking choices She will begin a regular exercise regimen and build up on this - Amb Ref to Medical Weight Management    Meds ordered this encounter  Medications   linaclotide (LINZESS) 72 MCG capsule    Sig: Take 1 capsule (72 mcg total) by mouth daily before breakfast.    Dispense:  90 capsule    Refill:  1   Tazarotene 0.045 % LOTN    Sig: Apply once daily nightly    Dispense:  45 g    Refill:  6    Follow-up: Return in about 6 months (around 04/05/2023) for Chronic medical conditions.       Hoy Register, MD, FAAFP. [facility] [place], [place] [phone]   10/04/2022, 6:33 PM

## 2022-11-01 ENCOUNTER — Encounter: Payer: Self-pay | Admitting: Obstetrics and Gynecology

## 2022-11-01 ENCOUNTER — Ambulatory Visit: Payer: Medicaid Other | Admitting: Obstetrics and Gynecology

## 2022-11-01 VITALS — BP 124/80 | Ht 61.5 in | Wt 219.0 lb

## 2022-11-01 DIAGNOSIS — Z3202 Encounter for pregnancy test, result negative: Secondary | ICD-10-CM | POA: Diagnosis not present

## 2022-11-01 DIAGNOSIS — N921 Excessive and frequent menstruation with irregular cycle: Secondary | ICD-10-CM

## 2022-11-01 DIAGNOSIS — Z3041 Encounter for surveillance of contraceptive pills: Secondary | ICD-10-CM

## 2022-11-01 LAB — POCT URINE PREGNANCY: Preg Test, Ur: NEGATIVE

## 2022-11-01 NOTE — Progress Notes (Signed)
Hoy Register, MD   Chief Complaint  Patient presents with   Vaginal Bleeding    Pt has been bleeding for the last 3 weeks, heavy flow, no abnormal pain.    HPI:      Ms. Meredith Hubbard is a 27 y.o. [id number] whose LMP was Patient's last menstrual period was 10/16/2022 (approximate)., presents today for AUB after OCP start. Mirena IUD removed 08/04/22; started OCPs mid cycle sometime after. Wasn't sexually active between IUD removal and OCP start; had not had a period yet when started OCPs. No bleeding first pill pack, no bleeding with placebo pills, then started bleeding beginning of 2nd pill pack. Changing products Q30-60 min with large clots sometimes/has had to change clothes; every day has been heavy. No late/missed pills, no dysmen. Was amenorrheic with IUD.  She is sexually active with husband; no pain/bleeding. No new partners. Neg pap 4/22  Patient Active Problem List   Diagnosis Date Noted   IUD (intrauterine device) in place 05/24/2021   Postpartum care following vaginal delivery 03/28/2021   Iron deficiency 02/18/2021   Marijuana use 08/25/2020   Supervision of other normal pregnancy, antepartum 08/13/2020    Past Surgical History:  Procedure Laterality Date   OPEN REDUCTION INTERNAL FIXATION (ORIF) TIBIA/FIBULA FRACTURE Right    age 34    Family History  Problem Relation Age of Onset   Hypertension Mother    Diabetes Maternal Uncle     Social History   Socioeconomic History   Marital status: Married    Spouse name: Feliz Beam   Number of children: Not on file   Years of education: Not on file   Highest education level: Not on file  Occupational History   Not on file  Tobacco Use   Smoking status: Never   Smokeless tobacco: Never  Vaping Use   Vaping Use: Never used  Substance and Sexual Activity   Alcohol use: Never   Drug use: Not Currently   Sexual activity: Yes    Birth control/protection: Pill  Other Topics Concern   Not on file  Social History  Narrative   Not on file   Social Determinants of Health   Financial Resource Strain: Not on file  Food Insecurity: Not on file  Transportation Needs: Not on file  Physical Activity: Not on file  Stress: Not on file  Social Connections: Not on file  Intimate Partner Violence: Not on file    Outpatient Medications Prior to Visit  Medication Sig Dispense Refill   linaclotide (LINZESS) 72 MCG capsule Take 1 capsule (72 mcg total) by mouth daily before breakfast. 90 capsule 1   meclizine (ANTIVERT) 25 MG tablet Take 1 tablet (25 mg total) by mouth 3 (three) times daily as needed for dizziness. 30 tablet 0   Norethindrone Acetate-Ethinyl Estrad-FE (MICROGESTIN 24 FE) 1-20 MG-MCG(24) tablet Take 1 tablet by mouth daily. 84 tablet 3   Tazarotene 0.045 % LOTN Apply once daily nightly 45 g 6   docusate sodium (COLACE) 100 MG capsule Take 1 capsule (100 mg total) by mouth daily. 60 capsule 0   tiZANidine (ZANAFLEX) 4 MG tablet Take 1 tablet (4 mg total) by mouth at bedtime. 90 tablet 0   No facility-administered medications prior to visit.      ROS:  Review of Systems  Constitutional:  Negative for fever.  Gastrointestinal:  Negative for blood in stool, constipation, diarrhea, nausea and vomiting.  Genitourinary:  Positive for menstrual problem. Negative for dyspareunia, dysuria, flank pain,  frequency, hematuria, urgency, vaginal bleeding, vaginal discharge and vaginal pain.  Musculoskeletal:  Negative for back pain.  Skin:  Negative for rash.   BREAST: No symptoms   OBJECTIVE:   Vitals:  BP 124/80   Ht 5' 1.5" (1.562 m)   Wt 219 lb (99.3 kg)   LMP 10/16/2022 (Approximate)   Breastfeeding No   BMI 40.71 kg/m   Physical Exam Vitals reviewed.  Constitutional:      Appearance: She is well-developed.  Pulmonary:     Effort: Pulmonary effort is normal.  Musculoskeletal:        General: Normal range of motion.     Cervical back: Normal range of motion.  Skin:    General:  Skin is warm and dry.  Neurological:     General: No focal deficit present.     Mental Status: She is alert and oriented to person, place, and time.     Cranial Nerves: No cranial nerve deficit.  Psychiatric:        Mood and Affect: Mood normal.        Behavior: Behavior normal.        Thought Content: Thought content normal.        Judgment: Judgment normal.     Results: Results for orders placed or performed in visit on 11/01/22 (from the past 24 hour(s))  POCT urine pregnancy     Status: Normal   Collection Time: 11/01/22  2:53 PM  Result Value Ref Range   Preg Test, Ur Negative Negative     Assessment/Plan: Menorrhagia with irregular cycle - Plan: POCT urine pregnancy; after OCP start. On 2nd pack pills, neg UPT.  Take 2 pills daily of active pills till placebo pills, then do placebo pills. See if stops bleeding until placebo pills. Then start 3rd pack. If heavy bleeding restarts, will check GYN u/s. Pt aware of irregular bleeding first few pill packs as body adjusts, but flow is abnormal this cycle.   Encounter for surveillance of contraceptive pills    Return if symptoms worsen or fail to improve.  Daveena Elmore B. Diania Co, PA-C 11/01/2022 2:55 PM

## 2022-11-01 NOTE — Patient Instructions (Signed)
I value your feedback and you entrusting us with your care. If you get a [facility] patient survey, I would appreciate you taking the time to let us know about your experience today. Thank you! ? ? ?

## 2022-11-14 ENCOUNTER — Encounter: Payer: Self-pay | Admitting: Obstetrics and Gynecology

## 2023-04-05 ENCOUNTER — Ambulatory Visit: Payer: Medicaid Other | Admitting: Family Medicine

## 2023-06-19 ENCOUNTER — Ambulatory Visit: Payer: Medicaid Other | Admitting: Family Medicine

## 2023-08-01 ENCOUNTER — Encounter: Payer: Self-pay | Admitting: Family Medicine

## 2023-08-01 ENCOUNTER — Telehealth (INDEPENDENT_AMBULATORY_CARE_PROVIDER_SITE_OTHER): Admitting: Family Medicine

## 2023-08-01 ENCOUNTER — Encounter: Payer: Self-pay | Admitting: Internal Medicine

## 2023-08-01 DIAGNOSIS — J208 Acute bronchitis due to other specified organisms: Secondary | ICD-10-CM

## 2023-08-01 DIAGNOSIS — R509 Fever, unspecified: Secondary | ICD-10-CM | POA: Diagnosis not present

## 2023-08-01 DIAGNOSIS — R062 Wheezing: Secondary | ICD-10-CM | POA: Diagnosis not present

## 2023-08-01 MED ORDER — ALBUTEROL SULFATE HFA 108 (90 BASE) MCG/ACT IN AERS: 2.0000 | INHALATION_SPRAY | Freq: Four times a day (QID) | RESPIRATORY_TRACT | 0 refills | Status: DC | PRN

## 2023-08-01 MED ORDER — PREDNISONE 20 MG PO TABS: 40.0000 mg | ORAL_TABLET | Freq: Every day | ORAL | 0 refills | Status: AC

## 2023-08-01 MED ORDER — LEVOCETIRIZINE DIHYDROCHLORIDE 5 MG PO TABS
5.0000 mg | ORAL_TABLET | Freq: Every evening | ORAL | 1 refills | Status: AC
Start: 2023-08-01 — End: ?

## 2023-08-01 NOTE — Patient Instructions (Signed)
 I have sent in prednisone for you to take 2 tablets once daily in the morning with breakfast for the next 5 days.  I have sent in an albuterol inhaler for you to use 2 puffs every 4 hours as needed for wheezing.  I have also sent in levocetirizine for you to take once daily.  Follow-up with me for new or worsening symptoms.

## 2023-08-01 NOTE — Progress Notes (Signed)
 Virtual Visit via Video Note  I connected with Meredith Hubbard on 08/01/23 at  1:20 PM EDT by a video enabled telemedicine application and verified that I am speaking with the correct person using two identifiers.  Patient Location: Home Provider Location: Office/Clinic  I discussed the limitations, risks, security, and privacy concerns of performing an evaluation and management service by video and the availability of in person appointments. I also discussed with the patient that there may be a patient responsible charge related to this service. The patient expressed understanding and agreed to proceed.  Subjective: PCP: Hoy Register, MD  Chief Complaint  Patient presents with   Cough   Cough   28 year old female presents for A/V visit for evaluation of 2-day history of cough, fever up to 103, chills.  Has taken Mucinex, has taken allergy medication, has also taken Tylenol Cold and flu with little relief. Was seen at urgent care and treated for acute sinusitis 07/18/2023. States at that time she had nasal congestion, headaches and sinus pain.  Denies those symptoms today. Just reports she is having cough that is lingering, and the fever. Denies known sick contacts. Denies abdominal pain, nausea, vomiting, diarrhea, rash, other symptoms. Medical history as outlined below.  ROS: Per HPI  Current Outpatient Medications:    albuterol (VENTOLIN HFA) 108 (90 Base) MCG/ACT inhaler, Inhale 2 puffs into the lungs every 6 (six) hours as needed for wheezing or shortness of breath., Disp: 8 g, Rfl: 0   levocetirizine (XYZAL) 5 MG tablet, Take 1 tablet (5 mg total) by mouth every evening., Disp: 90 tablet, Rfl: 1   predniSONE (DELTASONE) 20 MG tablet, Take 2 tablets (40 mg total) by mouth daily for 5 days., Disp: 10 tablet, Rfl: 0   linaclotide (LINZESS) 72 MCG capsule, Take 1 capsule (72 mcg total) by mouth daily before breakfast., Disp: 90 capsule, Rfl: 1   meclizine (ANTIVERT) 25 MG  tablet, Take 1 tablet (25 mg total) by mouth 3 (three) times daily as needed for dizziness., Disp: 30 tablet, Rfl: 0   Norethindrone Acetate-Ethinyl Estrad-FE (MICROGESTIN 24 FE) 1-20 MG-MCG(24) tablet, Take 1 tablet by mouth daily., Disp: 84 tablet, Rfl: 3   Tazarotene 0.045 % LOTN, Apply once daily nightly, Disp: 45 g, Rfl: 6  Observations/Objective: There were no vitals filed for this visit. Physical Exam Vitals and nursing note reviewed.  Constitutional:      General: She is not in acute distress. HENT:     Head: Normocephalic and atraumatic.  Eyes:     Extraocular Movements: Extraocular movements intact.  Pulmonary:     Effort: Pulmonary effort is normal.     Comments: Tight, dry cough Musculoskeletal:     Cervical back: Normal range of motion.  Neurological:     General: No focal deficit present.     Mental Status: She is alert and oriented to person, place, and time.  Psychiatric:        Mood and Affect: Mood normal.        Behavior: Behavior normal.     Assessment and Plan: Wheezing -     Albuterol Sulfate HFA; Inhale 2 puffs into the lungs every 6 (six) hours as needed for wheezing or shortness of breath.  Dispense: 8 g; Refill: 0 -     Levocetirizine Dihydrochloride; Take 1 tablet (5 mg total) by mouth every evening.  Dispense: 90 tablet; Refill: 1  Acute bronchitis due to other specified organisms -     predniSONE; Take 2  tablets (40 mg total) by mouth daily for 5 days.  Dispense: 10 tablet; Refill: 0  Fever, unspecified fever cause  Continue supportive care at home for fever, rest as needed  Follow Up Instructions: Return if symptoms worsen or fail to improve.   I spent 15 minutes on this patient encounter including counseling, diagnosis, treatment options, documentation, face-to-face time.  I discussed the assessment and treatment plan with the patient. The patient was provided an opportunity to ask questions, and all were answered. The patient agreed with  the plan and demonstrated an understanding of the instructions.   The patient was advised to call back or seek an in-person evaluation if the symptoms worsen or if the condition fails to improve as anticipated.  The above assessment and management plan was discussed with the patient. The patient verbalized understanding of and has agreed to the management plan.   Sherald Barge, FNP

## 2023-08-03 ENCOUNTER — Ambulatory Visit
Admission: EM | Admit: 2023-08-03 | Discharge: 2023-08-03 | Disposition: A | Discharge status: Home / Self Care | Attending: Family Medicine | Admitting: Family Medicine

## 2023-08-03 ENCOUNTER — Ambulatory Visit (INDEPENDENT_AMBULATORY_CARE_PROVIDER_SITE_OTHER)

## 2023-08-03 DIAGNOSIS — B349 Viral infection, unspecified: Secondary | ICD-10-CM

## 2023-08-03 DIAGNOSIS — R051 Acute cough: Secondary | ICD-10-CM

## 2023-08-03 LAB — POC SARS CORONAVIRUS 2 AG -  ED: SARS Coronavirus 2 Ag: NEGATIVE

## 2023-08-03 MED ORDER — AZITHROMYCIN 250 MG PO TABS: 250.0000 mg | ORAL_TABLET | Freq: Every day | ORAL | 0 refills | Status: AC

## 2023-08-03 MED ORDER — BENZONATATE 200 MG PO CAPS: 200.0000 mg | ORAL_CAPSULE | Freq: Three times a day (TID) | ORAL | 0 refills | Status: AC | PRN

## 2023-08-03 NOTE — Discharge Instructions (Addendum)
 You tested negative for COVID.  Continue the albuterol inhaler and prednisone as previously prescribed.  You may take Tessalon 3 times a day as needed for your cough.  Lots of rest and fluids.  A provisional prescription for azithromycin which is an antibiotic has been provided.  Please do not take unless your symptoms do not improve or worsen by 4/14.  Please follow-up with your PCP if your symptoms do not improve.  Please go to the ER for any worsening symptoms.  Hope you feel better soon!

## 2023-08-03 NOTE — ED Triage Notes (Signed)
 Pt c/o cough shortness of breath  since Sunday.Using inhaler and allergy medication.

## 2023-08-03 NOTE — ED Provider Notes (Addendum)
 [facility]    CSN: [id number] Arrival date & time: 08/03/23  1824      History   Chief Complaint No chief complaint on file.   HPI Meredith Hubbard is a 28 y.o. female  presents for evaluation of URI symptoms for 5 days. Patient reports associated symptoms of cough, congestion, shortness of breath. Denies N/V/D, fever, sore throat, ear pain, body aches. Patient does not have a hx of asthma. Patient is not an active smoker.   Reports no sick contacts.  States completed antibiotics for sinus infection little over a week ago.  She also did a telehealth visit on 4/8 for the same symptoms.  She was prescribed albuterol inhaler, prednisone and allergy medicine.  She states she started this yesterday.  Pt has taken nothing OTC for symptoms. Pt has no other concerns at this time.   HPI  Past Medical History:  Diagnosis Date   Herpes    HSV-2 infection    Normal spontaneous vaginal delivery 03/28/2021    Patient Active Problem List   Diagnosis Date Noted   IUD (intrauterine device) in place 05/24/2021   Postpartum care following vaginal delivery 03/28/2021   Iron deficiency 02/18/2021   Marijuana use 08/25/2020   Supervision of other normal pregnancy, antepartum 08/13/2020    Past Surgical History:  Procedure Laterality Date   OPEN REDUCTION INTERNAL FIXATION (ORIF) TIBIA/FIBULA FRACTURE Right    age 39    OB History     Gravida  2   Para  2   Term  2   Preterm      AB      Living  2      SAB      IAB      Ectopic      Multiple  0   Live Births  2            Home Medications    Prior to Admission medications   Medication Sig Start Date End Date Taking? Authorizing Provider  azithromycin (ZITHROMAX) 250 MG tablet Take 1 tablet (250 mg total) by mouth daily. Take first 2 tablets together, then 1 every day until finished. 08/07/23  Yes Radford Pax, NP  benzonatate (TESSALON) 200 MG capsule Take 1 capsule (200 mg total) by mouth 3 (three) times  daily as needed. 08/03/23  Yes Radford Pax, NP  albuterol (VENTOLIN HFA) 108 (90 Base) MCG/ACT inhaler Inhale 2 puffs into the lungs every 6 (six) hours as needed for wheezing or shortness of breath. 08/01/23   Sherald Barge, FNP  levocetirizine (XYZAL) 5 MG tablet Take 1 tablet (5 mg total) by mouth every evening. 08/01/23   Sherald Barge, FNP  linaclotide Karlene Einstein) 72 MCG capsule Take 1 capsule (72 mcg total) by mouth daily before breakfast. 10/04/22   Hoy Register, MD  meclizine (ANTIVERT) 25 MG tablet Take 1 tablet (25 mg total) by mouth 3 (three) times daily as needed for dizziness. 06/30/22   Hoy Register, MD  Norethindrone Acetate-Ethinyl Estrad-FE (MICROGESTIN 24 FE) 1-20 MG-MCG(24) tablet Take 1 tablet by mouth daily. 08/04/22   Copland, Ilona Sorrel, PA-C  predniSONE (DELTASONE) 20 MG tablet Take 2 tablets (40 mg total) by mouth daily for 5 days. 08/01/23 08/06/23  Sherald Barge, FNP  Tazarotene 0.045 % LOTN Apply once daily nightly 10/04/22   Hoy Register, MD    Family History Family History  Problem Relation Age of Onset   Hypertension Mother    Diabetes  Maternal Uncle     Social History Social History   Tobacco Use   Smoking status: Never   Smokeless tobacco: Never  Vaping Use   Vaping status: Never Used  Substance Use Topics   Alcohol use: Never   Drug use: Not Currently     Allergies   Codeine   Review of Systems Review of Systems  HENT:  Positive for congestion.   Respiratory:  Positive for cough and shortness of breath.      Physical Exam Triage Vital Signs ED Triage Vitals  Encounter Vitals Group     BP 08/03/23 1845 137/89     Systolic BP Percentile --      Diastolic BP Percentile --      Pulse Rate 08/03/23 1845 (!) 108     Resp 08/03/23 1845 20     Temp 08/03/23 1845 98.8 F (37.1 C)     Temp Source 08/03/23 1845 Oral     SpO2 08/03/23 1845 98 %     Weight --      Height --      Head Circumference --      Peak Flow --       Pain Score 08/03/23 1843 0     Pain Loc --      Pain Education --      Exclude from Growth Chart --    No data found.  Updated Vital Signs BP 137/89 (BP Location: Right Arm)   Pulse (!) 108   Temp 98.8 F (37.1 C) (Oral)   Resp 20   LMP 05/04/2023 (Approximate)   SpO2 98%   Visual Acuity Right Eye Distance:   Left Eye Distance:   Bilateral Distance:    Right Eye Near:   Left Eye Near:    Bilateral Near:     Physical Exam Vitals and nursing note reviewed.  Constitutional:      General: She is not in acute distress.    Appearance: She is well-developed. She is not ill-appearing.  HENT:     Head: Normocephalic and atraumatic.     Right Ear: Tympanic membrane and ear canal normal.     Left Ear: Tympanic membrane and ear canal normal.     Nose: Congestion present.     Mouth/Throat:     Mouth: Mucous membranes are moist.     Pharynx: Oropharynx is clear. Uvula midline. No posterior oropharyngeal erythema.     Tonsils: No tonsillar exudate or tonsillar abscesses.  Eyes:     Conjunctiva/sclera: Conjunctivae normal.     Pupils: Pupils are equal, round, and reactive to light.  Cardiovascular:     Rate and Rhythm: Regular rhythm. Tachycardia present.     Heart sounds: Normal heart sounds.     Comments: Mildly tachy at 108 Pulmonary:     Effort: Pulmonary effort is normal.     Breath sounds: Normal breath sounds. No wheezing, rhonchi or rales.  Musculoskeletal:     Cervical back: Normal range of motion and neck supple.  Lymphadenopathy:     Cervical: No cervical adenopathy.  Skin:    General: Skin is warm and dry.  Neurological:     General: No focal deficit present.     Mental Status: She is alert and oriented to person, place, and time.  Psychiatric:        Mood and Affect: Mood normal.        Behavior: Behavior normal.      UC Treatments / Results  Labs (all labs ordered are listed, but only abnormal results are displayed) Labs Reviewed  POC SARS  CORONAVIRUS 2 AG -  ED   CMP14+EGFR Order: [id number]  Status: Final result     Next appt: None     Dx: Screening for metabolic disorder   Test Result Released: Yes (seen)   0 Result Notes        Component Ref Range & Units (hover) 1 yr ago (06/30/22) 1 yr ago (08/05/21) 2 yr ago (08/10/20) 4 yr ago (11/25/18) 4 yr ago (11/24/18)  Glucose 85 100 High  CM 70 CM 84 97  BUN 11 11 10 11 13   Creatinine, Ser 0.74 0.67 R 0.65 R 0.64 R 0.60 R  eGFR 114      BUN/Creatinine Ratio 15      Sodium 143 137 R 133 Low  R 139 R 139 R  Potassium 4.6 3.7 R 3.8 R 4.0 R 3.8 R  Chloride 104 107 R 102 R 108 R 105 R  CO2 23 22 R 22 R 24 R 24 R  Calcium 10.0 8.9 R 9.0 R 8.8 Low  R 9.3 R  Total Protein 7.3 7.2 R   7.4 R  Albumin 4.2 3.8 R   4.1 R  Globulin, Total 3.1      Albumin/Globulin Ratio 1.4      Bilirubin Total 0.2 0.6 R   0.4 R  Alkaline Phosphatase 118 95 R   73 R  AST 20 25 R   20 R  ALT 27 26 R   17 R  Resulting Agency [facility]         Narrative Performed by: Verdell Carmine Performed at:  [address] [employer] [address] [place], [place]  [id number] Lab Director: Jolene Schimke MD, Phone:  [phone]  Specimen Collected: 06/30/22 09:32 Last Resulted: 07/01/22 05:37       EKG   Radiology No results found.  Procedures Procedures (including critical care time)  Medications Ordered in UC Medications - No data to display  Initial Impression / Assessment and Plan / UC Course  I have reviewed the triage vital signs and the nursing notes.  Pertinent labs & imaging results that were available during my care of the patient were reviewed by me and considered in my medical decision making (see chart for details).     Reviewed exam and symptoms with patient.  No red flags.  Wet read of chest x-ray without obvious consolidation, will contact for any positive results based on radiology overread once available.  Will have patient continue  albuterol inhaler and prednisone.  Will add on Tessalon as needed cough.  Provisional prescription for azithromycin provided with instruction not to take unless symptoms do not improve or worsen by 4/14 and she verbalized understanding.  PCP follow-up if symptoms do not improve.  ER precautions reviewed and patient verbalized understanding. Final Clinical Impressions(s) / UC Diagnoses   Final diagnoses:  Acute cough  Viral illness     Discharge Instructions      You tested negative for COVID.  Continue the albuterol inhaler and prednisone as previously prescribed.  You may take Tessalon 3 times a day as needed for your cough.  Lots of rest and fluids.  A provisional prescription for azithromycin which is an antibiotic has been provided.  Please do not take unless your symptoms do not improve or worsen by 4/14.  Please follow-up with  your PCP if your symptoms do not improve.  Please go to the ER for any worsening symptoms.  Hope you feel better soon!     ED Prescriptions     Medication Sig Dispense Auth. Provider   benzonatate (TESSALON) 200 MG capsule Take 1 capsule (200 mg total) by mouth 3 (three) times daily as needed. 20 capsule Radford Pax, NP   azithromycin (ZITHROMAX) 250 MG tablet Take 1 tablet (250 mg total) by mouth daily. Take first 2 tablets together, then 1 every day until finished. 6 tablet Radford Pax, NP      PDMP not reviewed this encounter.   Radford Pax, NP 08/03/23 1935    Radford Pax, NP 08/03/23 [phone]

## 2023-08-28 ENCOUNTER — Other Ambulatory Visit: Payer: Self-pay | Admitting: Family Medicine

## 2023-08-28 DIAGNOSIS — R062 Wheezing: Secondary | ICD-10-CM

## 2024-03-16 ENCOUNTER — Encounter: Payer: Self-pay | Admitting: Family Medicine

## 2024-03-20 ENCOUNTER — Ambulatory Visit: Admitting: Family Medicine

## 2024-03-20 NOTE — Telephone Encounter (Deleted)
 Meredith Hubbard

## 2024-04-09 ENCOUNTER — Ambulatory Visit: Admitting: Family Medicine
